# Patient Record
Sex: Female | Born: 1965 | Race: White | Hispanic: No | Marital: Single | Smoking: Current every day smoker
Health system: Southern US, Community
[De-identification: ages and names within clinical notes are randomized; demographics above are authoritative.]

## PROBLEM LIST (undated history)

## (undated) DIAGNOSIS — I10 Essential (primary) hypertension: Secondary | ICD-10-CM

## (undated) HISTORY — PX: APPENDECTOMY: SHX54

---

## 1999-01-18 ENCOUNTER — Emergency Department (HOSPITAL_COMMUNITY): Admission: EM | Admit: 1999-01-18 | Discharge: 1999-01-18 | Payer: Self-pay

## 2000-04-05 ENCOUNTER — Emergency Department (HOSPITAL_COMMUNITY): Admission: EM | Admit: 2000-04-05 | Discharge: 2000-04-05 | Payer: Self-pay | Admitting: Emergency Medicine

## 2000-08-16 ENCOUNTER — Emergency Department (HOSPITAL_COMMUNITY): Admission: EM | Admit: 2000-08-16 | Discharge: 2000-08-16 | Payer: Self-pay | Admitting: *Deleted

## 2000-08-31 ENCOUNTER — Encounter: Payer: Self-pay | Admitting: Emergency Medicine

## 2000-08-31 ENCOUNTER — Emergency Department (HOSPITAL_COMMUNITY): Admission: EM | Admit: 2000-08-31 | Discharge: 2000-08-31 | Payer: Self-pay | Admitting: Emergency Medicine

## 2000-12-07 ENCOUNTER — Emergency Department (HOSPITAL_COMMUNITY): Admission: EM | Admit: 2000-12-07 | Discharge: 2000-12-07 | Payer: Self-pay | Admitting: Emergency Medicine

## 2001-01-17 ENCOUNTER — Emergency Department (HOSPITAL_COMMUNITY): Admission: EM | Admit: 2001-01-17 | Discharge: 2001-01-18 | Payer: Self-pay | Admitting: Emergency Medicine

## 2001-01-17 ENCOUNTER — Emergency Department (HOSPITAL_COMMUNITY): Admission: EM | Admit: 2001-01-17 | Discharge: 2001-01-17 | Payer: Self-pay | Admitting: Emergency Medicine

## 2001-02-07 ENCOUNTER — Emergency Department (HOSPITAL_COMMUNITY): Admission: EM | Admit: 2001-02-07 | Discharge: 2001-02-07 | Payer: Self-pay | Admitting: Emergency Medicine

## 2001-03-05 ENCOUNTER — Emergency Department (HOSPITAL_COMMUNITY): Admission: EM | Admit: 2001-03-05 | Discharge: 2001-03-05 | Payer: Self-pay | Admitting: Internal Medicine

## 2001-05-14 ENCOUNTER — Encounter: Payer: Self-pay | Admitting: Internal Medicine

## 2001-05-14 ENCOUNTER — Emergency Department (HOSPITAL_COMMUNITY): Admission: EM | Admit: 2001-05-14 | Discharge: 2001-05-14 | Payer: Self-pay | Admitting: Internal Medicine

## 2001-07-08 ENCOUNTER — Emergency Department (HOSPITAL_COMMUNITY): Admission: EM | Admit: 2001-07-08 | Discharge: 2001-07-08 | Payer: Self-pay | Admitting: Emergency Medicine

## 2001-08-01 ENCOUNTER — Emergency Department (HOSPITAL_COMMUNITY): Admission: EM | Admit: 2001-08-01 | Discharge: 2001-08-01 | Payer: Self-pay | Admitting: Emergency Medicine

## 2002-01-31 ENCOUNTER — Emergency Department (HOSPITAL_COMMUNITY): Admission: EM | Admit: 2002-01-31 | Discharge: 2002-01-31 | Payer: Self-pay | Admitting: Emergency Medicine

## 2002-07-23 ENCOUNTER — Emergency Department (HOSPITAL_COMMUNITY): Admission: EM | Admit: 2002-07-23 | Discharge: 2002-07-23 | Payer: Self-pay | Admitting: Emergency Medicine

## 2003-08-20 ENCOUNTER — Emergency Department (HOSPITAL_COMMUNITY): Admission: EM | Admit: 2003-08-20 | Discharge: 2003-08-20 | Payer: Self-pay | Admitting: Emergency Medicine

## 2004-01-05 ENCOUNTER — Emergency Department (HOSPITAL_COMMUNITY): Admission: EM | Admit: 2004-01-05 | Discharge: 2004-01-05 | Payer: Self-pay | Admitting: Emergency Medicine

## 2004-09-19 ENCOUNTER — Emergency Department (HOSPITAL_COMMUNITY): Admission: EM | Admit: 2004-09-19 | Discharge: 2004-09-19 | Payer: Self-pay | Admitting: Emergency Medicine

## 2004-09-30 ENCOUNTER — Ambulatory Visit: Payer: Self-pay | Admitting: Internal Medicine

## 2005-04-25 ENCOUNTER — Emergency Department (HOSPITAL_COMMUNITY): Admission: EM | Admit: 2005-04-25 | Discharge: 2005-04-25 | Payer: Self-pay | Admitting: Emergency Medicine

## 2007-02-28 ENCOUNTER — Emergency Department (HOSPITAL_COMMUNITY): Admission: EM | Admit: 2007-02-28 | Discharge: 2007-02-28 | Payer: Self-pay | Admitting: Family Medicine

## 2011-11-07 ENCOUNTER — Emergency Department: Payer: Self-pay | Admitting: Emergency Medicine

## 2016-06-28 ENCOUNTER — Encounter: Payer: Self-pay | Admitting: Medical Oncology

## 2016-06-28 ENCOUNTER — Emergency Department
Admission: EM | Admit: 2016-06-28 | Discharge: 2016-06-28 | Disposition: A | Discharge status: Home / Self Care | Payer: Self-pay | Attending: Emergency Medicine | Admitting: Emergency Medicine

## 2016-06-28 DIAGNOSIS — K047 Periapical abscess without sinus: Secondary | ICD-10-CM | POA: Insufficient documentation

## 2016-06-28 MED ORDER — IBUPROFEN 600 MG PO TABS: 600.0000 mg | ORAL_TABLET | Freq: Three times a day (TID) | ORAL | 0 refills | Status: DC | PRN

## 2016-06-28 MED ORDER — CLINDAMYCIN HCL 150 MG PO CAPS: 150.0000 mg | ORAL_CAPSULE | Freq: Four times a day (QID) | ORAL | 0 refills | Status: DC

## 2016-06-28 MED ORDER — OXYCODONE-ACETAMINOPHEN 5-325 MG PO TABS: 1.0000 | ORAL_TABLET | Freq: Four times a day (QID) | ORAL | 0 refills | Status: AC | PRN

## 2016-06-28 MED ORDER — ERYTHROMYCIN BASE 500 MG PO TABS: 500.0000 mg | ORAL_TABLET | Freq: Four times a day (QID) | ORAL | 0 refills | Status: DC

## 2016-06-28 NOTE — Discharge Instructions (Signed)
Follow-up for full list of dental clinics provided OPTIONS FOR DENTAL FOLLOW UP CARE  [place] Department of Health and Human Services - Local Safety Net Dental Clinics [email or web address]://www.ncdhhs.gov/dph/oralhealth/services/safetynetclinics.htm   [facility] [phone])  [facility] [phone])  [facility] [phone])  [facility] [phone])  [facility] [phone]) This clinic caters to the indigent population and is on a lottery system. Location: [employer], [address], [address], [place] [facility] Hours: Wednesdays from 6pm - 9pm, patients seen by a lottery system. For dates, call or go to [email or web address] Services: Cleanings, fillings and simple extractions. Payment Options: DENTAL WORK IS FREE OF CHARGE. Bring proof of income or support. Best way to get seen: Arrive at 5:15 pm - this is a lottery, NOT first come/first serve, so arriving earlier will not increase your chances of being seen.     [facility] [phone] Select option 1 for emergencies   Location: [facility] [employer] [facility], [address], [address], [place] [facility] Hours: No walk-ins accepted - call the day before to schedule an appointment. Check in times are 9:30 am and 1:30 pm. Services: Simple extractions, temporary fillings, pulpectomy/pulp debridement, uncomplicated abscess drainage. Payment Options: PAYMENT IS DUE AT THE TIME OF SERVICE.  Fee is usually $100-200, additional surgical procedures (e.g. abscess drainage) may be extra. Cash, checks, Visa/MasterCard accepted.  Can file Medicaid if patient is covered for dental - patient should call case worker to check. No discount for [facility] patients. Best way to get seen: MUST call the day before and get onto the schedule. Can usually be seen the next 1-2 days. No walk-ins accepted.      [facility] [phone]   Location: [facility], [address], [place] [facility] Hours: M, W, Th, F 8am or 1:30pm, Tues 9a or 1:30 - first come/first served. Services: Simple extractions, temporary fillings, uncomplicated abscess drainage.  You do not need to be [place] resident. Payment Options: PAYMENT IS DUE AT THE TIME OF SERVICE. Dental insurance, otherwise sliding scale - bring proof of income or support. Depending on income and treatment needed, cost is usually $50-200. Best way to get seen: Arrive early as it is first come/first served.     [facility] [phone]   Location: [address] Clinic Hours: Mon-Thu 8a-5p Services: Most basic dental services including extractions and fillings. Payment Options: PAYMENT IS DUE AT THE TIME OF SERVICE. Sliding scale, up to 50% off - bring proof if income or support. Medicaid with dental option accepted. Best way to get seen: Call to schedule an appointment, can usually be seen within 2 weeks OR they will try to see walk-ins - show up at 8a or 2p (you may have to wait).     [facility] [phone] [place] RESIDENTS ONLY   Location: [facility], [address], [place], [place] [address] Clinic Hours: By appointment only. Monday - Thursday 8am-5pm, Friday 8am-12pm Services: Cleanings, fillings, extractions. Payment Options: PAYMENT IS DUE AT THE TIME OF SERVICE. Cash, Visa or MasterCard. Sliding scale - $30 minimum per service. Best way to get seen: Come in to office, complete packet and make an appointment - need proof of income or support monies for each household member and proof of [place] residence. Usually takes about a month to get in.     [facility] °[phone] °  °Location: °[address] [place] °Clinic Hours: Walk-in Urgent Care  Dental Services are offered Monday-Friday mornings only. °The numbers of emergencies accepted daily is limited to the number of °providers available. °Maximum 15 - Mondays, Wednesdays & Thursdays °Maximum 10 - Tuesdays & Fridays °Services: °You do not need to be a [place] resident to be seen for a dental emergency. °Emergencies are defined as pain, swelling, abnormal bleeding, or dental trauma. Walkins will receive x-rays if needed. °NOTE: Dental cleaning is not an emergency. °Payment Options: °PAYMENT IS DUE AT THE TIME OF SERVICE. °Minimum co-pay is $40.00 for uninsured patients. °Minimum co-pay is $3.00 for Medicaid with dental coverage. °Dental Insurance is accepted and must be presented at time of visit. °Medicare does not cover dental. °Forms of payment: Cash, credit card, checks. °Best way to get seen: °If not previously registered with the clinic, walk-in dental registration begins at 7:15 am and is on a first come/first serve basis. °If previously registered with the clinic, call to make an appointment. °  °  °[facility] °[phone] °[place] RESIDENTS ONLY °  °Location: °[address], [place], [place] °Clinic Hours: °Mon-Thu 10a-2p °Services: Extractions only! °Payment Options: °FREE (donations accepted) - bring proof of income or support °Best way to get seen: °Call and schedule an appointment OR come at 8am on the 1st Monday of every month (except for holidays) when it is first come/first served. °  °  °[facility] °[phone] °  °Location: °[address], [place] °Clinic Hours: °Friday mornings °Services, Payment Options, Best way to get seen: °Call for info ° °

## 2016-06-28 NOTE — ED Triage Notes (Signed)
Left lower dental pain since sunday

## 2016-06-28 NOTE — ED Provider Notes (Signed)
[facility] Emergency Department Provider Note    None    (approximate)  I have reviewed the triage vital signs and the nursing notes.   HISTORY  Chief Complaint Dental Pain   HPI Carrie Mcdaniel is a 50 y.o. female presents to the ED with dental pain for 2 days. Patient states dental pain is coming from the tooth on the bottom left side and is beginning to radiate down the left side of her neck. She describes the pain as throbbing, 9/10, and accompanied with swelling and frontal headache. The pain awakens her from sleep and prevents her from chewing food on the left side. She has a dentist appointment next week for tooth extraction, but Tylenol and ibuprofen are no longer effective for the pain. Other palliative measures include salt water, garlic clove, hot and cold compresses. Patient denies ear pain, eye pain, fever, trouble swallowing, vision changes, shortness of breath, or chest pain.  History reviewed. No pertinent past medical history.  There are no active problems to display for this patient.   No past surgical history on file.  Prior to Admission medications   Medication Sig Start Date End Date Taking? Authorizing Provider  erythromycin base (E-MYCIN) 500 MG tablet Take 1 tablet (500 mg total) by mouth 4 (four) times daily. 06/28/16   Joni Reining, PA-C  ibuprofen (ADVIL,MOTRIN) 600 MG tablet Take 1 tablet (600 mg total) by mouth every 8 (eight) hours as needed. 06/28/16   Joni Reining, PA-C  oxyCODONE-acetaminophen (ROXICET) 5-325 MG tablet Take 1 tablet by mouth every 6 (six) hours as needed. 06/28/16 06/28/17  Joni Reining, PA-C    Allergies Penicillins  No family history on file.  Social History Social History  Substance Use Topics  . Smoking status: Not on file  . Smokeless tobacco: Not on file  . Alcohol use Not on file    Review of Systems Constitutional: No fever/chills Eyes: No visual changes.No eye pain ENT: Left-sided  dental pain pain radiating to neck with facial swelling. No sore throat. No trouble swallowing. No mastoid pain. Cardiovascular: Denies chest pain. Respiratory: Denies shortness of breath. Gastrointestinal: No abdominal pain.  No nausea, no vomiting.  No diarrhea.  No constipation. Genitourinary: Negative for dysuria. Musculoskeletal: Negative for back pain. Skin: Negative for rash. Neurological: Positive for headache. Negative focal weakness or numbness. 10-point ROS otherwise negative.  ____________________________________________   PHYSICAL EXAM:  VITAL SIGNS: ED Triage Vitals  Enc Vitals Group     BP 06/28/16 0839 (!) 158/87     Pulse Rate 06/28/16 0839 76     Resp 06/28/16 0839 16     Temp 06/28/16 0839 98.3 F (36.8 C)     Temp Source 06/28/16 0839 Oral     SpO2 06/28/16 0839 99 %     Weight 06/28/16 0839 147 lb 9 oz (66.9 kg)     Height 06/28/16 0839  (1.727 m)     Head Circumference --      Peak Flow --      Pain Score 06/28/16 0835 9     Pain Loc --      Pain Edu? --      Excl. in GC? --    Constitutional: Alert and oriented. Well appearing and in no acute distress. Eyes: Conjunctivae are normal. PERRL. EOMI. Head: Atraumatic. Nose: No congestion/rhinnorhea. Mouth/Throat: Teeth discoloration and gum swelling left lower jaw. Tenderness to palpation of tooth. Mild facial swelling at left jaw  noted. Mucous membranes are moist.  Oropharynx non-erythematous. Neck: No stridor.  Hematological/Lymphatic/Immunilogical: No cervical lymphadenopathy. Cardiovascular: Normal rate, regular rhythm. Grossly normal heart sounds.  Good peripheral circulation. Respiratory: Normal respiratory effort.  No retractions. Lungs CTAB. Gastrointestinal: Soft and nontender. No distention. No abdominal bruits. No CVA tenderness. Genitourinary: Deferred Musculoskeletal: No lower extremity tenderness nor edema.  No joint effusions. Neurologic:  Normal speech and language. No gross focal  neurologic deficits are appreciated. No gait instability. Skin:  Skin is warm, dry and intact. No rash noted. Psychiatric: Mood and affect are normal. Speech and behavior are normal.  ____________________________________________   LABS (all labs ordered are listed, but only abnormal results are displayed)  Labs Reviewed - No data to display ____________________________________________  EKG  ____________________________________________  RADIOLOGY  ____________________________________________   PROCEDURES  Procedure(s) performed: None  Procedures  Critical Care performed: No  ____________________________________________   INITIAL IMPRESSION / ASSESSMENT AND PLAN / ED COURSE  Pertinent labs & imaging results that were available during my care of the patient were reviewed by me and considered in my medical decision making (see chart for details).  Dental abscess. Patient given discharge Instructions. Patient advised to follow-up for scheduled an appointment next week. Patient given a prescription for erythromycin, ibuprofen, and Percocets.  Clinical Course     ____________________________________________   FINAL CLINICAL IMPRESSION(S) / ED DIAGNOSES  Final diagnoses:  Abscess, dental      NEW MEDICATIONS STARTED DURING THIS VISIT:  New Prescriptions   ERYTHROMYCIN BASE (E-MYCIN) 500 MG TABLET    Take 1 tablet (500 mg total) by mouth 4 (four) times daily.   IBUPROFEN (ADVIL,MOTRIN) 600 MG TABLET    Take 1 tablet (600 mg total) by mouth every 8 (eight) hours as needed.   OXYCODONE-ACETAMINOPHEN (ROXICET) 5-325 MG TABLET    Take 1 tablet by mouth every 6 (six) hours as needed.     Note:  This document was prepared using Dragon voice recognition software and may include unintentional dictation errors.    Joni Reiningonald K Smith, PA-C 06/28/16 1027    Joni Reiningonald K Smith, PA-C 06/28/16 1027    Nita Sicklearolina Veronese, MD 06/28/16 [phone]

## 2017-06-25 ENCOUNTER — Emergency Department
Admission: EM | Admit: 2017-06-25 | Discharge: 2017-06-25 | Disposition: A | Discharge status: Home / Self Care | Payer: Self-pay | Attending: Emergency Medicine | Admitting: Emergency Medicine

## 2017-06-25 ENCOUNTER — Encounter: Payer: Self-pay | Admitting: Emergency Medicine

## 2017-06-25 ENCOUNTER — Emergency Department: Payer: Self-pay

## 2017-06-25 DIAGNOSIS — J40 Bronchitis, not specified as acute or chronic: Secondary | ICD-10-CM | POA: Insufficient documentation

## 2017-06-25 DIAGNOSIS — F17210 Nicotine dependence, cigarettes, uncomplicated: Secondary | ICD-10-CM | POA: Insufficient documentation

## 2017-06-25 MED ORDER — PREDNISONE 20 MG PO TABS
60.0000 mg | ORAL_TABLET | Freq: Once | ORAL | Status: AC
  Administered 2017-06-25: 60 mg via ORAL
  Filled 2017-06-25: qty 3

## 2017-06-25 MED ORDER — AZITHROMYCIN 250 MG PO TABS: ORAL_TABLET | ORAL | 0 refills | Status: DC

## 2017-06-25 MED ORDER — AZITHROMYCIN 500 MG PO TABS
500.0000 mg | ORAL_TABLET | Freq: Once | ORAL | Status: AC
  Administered 2017-06-25: 500 mg via ORAL
  Filled 2017-06-25: qty 1

## 2017-06-25 MED ORDER — DOXYCYCLINE HYCLATE 100 MG PO TABS
100.0000 mg | ORAL_TABLET | Freq: Once | ORAL | Status: DC
Start: 2017-06-25 — End: 2017-06-25

## 2017-06-25 MED ORDER — IPRATROPIUM-ALBUTEROL 0.5-2.5 (3) MG/3ML IN SOLN
3.0000 mL | Freq: Once | RESPIRATORY_TRACT | Status: AC
  Administered 2017-06-25: 3 mL via RESPIRATORY_TRACT
  Filled 2017-06-25: qty 3

## 2017-06-25 MED ORDER — ALBUTEROL SULFATE HFA 108 (90 BASE) MCG/ACT IN AERS: 2.0000 | INHALATION_SPRAY | Freq: Four times a day (QID) | RESPIRATORY_TRACT | 0 refills | Status: DC | PRN

## 2017-06-25 MED ORDER — PREDNISONE 10 MG PO TABS: ORAL_TABLET | ORAL | 0 refills | Status: DC

## 2017-06-25 NOTE — ED Provider Notes (Signed)
[facility] Emergency Department Provider Note  ____________________________________________  Time seen: Approximately 10:12 AM  I have reviewed the triage vital signs and the nursing notes.   HISTORY  Chief Complaint Cough    HPI Carrie Mcdaniel is a 51 y.o. female presents to the emergency department with a productive cough for 2 weeks. She is coughing up yellow sputum. She had chills the first couple of days but these have resolved. She has tried DayQuil and NyQuil for symptoms. She smokes a pack of cigarettes per day. No sick contacts. She denies headache, nasal congestion, sore throat, shortness of breath, chest pain, nausea, vomiting, abdominal pain.   No past medical history on file.  There are no active problems to display for this patient.   History reviewed. No pertinent surgical history.  Prior to Admission medications   Medication Sig Start Date End Date Taking? Authorizing Provider  albuterol (PROVENTIL HFA;VENTOLIN HFA) 108 (90 Base) MCG/ACT inhaler Inhale 2 puffs into the lungs every 6 (six) hours as needed for wheezing or shortness of breath. 06/25/17   Enid DerryWagner, Kyuss Hale, PA-C  azithromycin (ZITHROMAX Z-PAK) 250 MG tablet Take 2 tablets (500 mg) on  Day 1,  followed by 1 tablet (250 mg) once daily on Days 2 through 5. 06/25/17   Enid DerryWagner, [place] Luppino, PA-C  clindamycin (CLEOCIN) 150 MG capsule Take 1 capsule (150 mg total) by mouth 4 (four) times daily. 06/28/16   Joni ReiningSmith, Ronald K, PA-C  erythromycin base (E-MYCIN) 500 MG tablet Take 1 tablet (500 mg total) by mouth 4 (four) times daily. 06/28/16   Joni ReiningSmith, Ronald K, PA-C  ibuprofen (ADVIL,MOTRIN) 600 MG tablet Take 1 tablet (600 mg total) by mouth every 8 (eight) hours as needed. 06/28/16   Joni ReiningSmith, Ronald K, PA-C  ibuprofen (ADVIL,MOTRIN) 600 MG tablet Take 1 tablet (600 mg total) by mouth every 8 (eight) hours as needed. 06/28/16   Joni ReiningSmith, Ronald K, PA-C  oxyCODONE-acetaminophen (ROXICET) 5-325 MG tablet Take 1  tablet by mouth every 6 (six) hours as needed. 06/28/16 06/28/17  Joni ReiningSmith, Ronald K, PA-C  oxyCODONE-acetaminophen (ROXICET) 5-325 MG tablet Take 1 tablet by mouth every 6 (six) hours as needed. 06/28/16 06/28/17  Joni ReiningSmith, Ronald K, PA-C  predniSONE (DELTASONE) 10 MG tablet Take 6 tablets on day 1, take 5 tablets on day 2, take 4 tablets on day 3, take 3 tablets on day 4, take 2 tablets on day 5, take 1 tablet on day 6 06/25/17   Enid DerryWagner, Caidin Heidenreich, PA-C    Allergies Penicillins  No family history on file.  Social History Social History  Substance Use Topics  . Smoking status: Current Every Day Smoker    Packs/day: 1.00    Types: Cigarettes  . Smokeless tobacco: Never Used  . Alcohol use Not on file     Review of Systems  Eyes: No visual changes. No discharge. ENT: Negative for congestion and rhinorrhea. Cardiovascular: No chest pain. Respiratory: Positive for cough. No SOB. Gastrointestinal: No abdominal pain.  No nausea, no vomiting.  No diarrhea.  No constipation. Musculoskeletal: Negative for musculoskeletal pain. Skin: Negative for rash, abrasions, lacerations, ecchymosis. Neurological: Negative for headaches.   ____________________________________________   PHYSICAL EXAM:  VITAL SIGNS: ED Triage Vitals  Enc Vitals Group     BP 06/25/17 0917 (!) 174/103     Pulse Rate 06/25/17 0917 (!) 57     Resp 06/25/17 0917 20     Temp 06/25/17 0917 98.2 F (36.8 C)     Temp Source  06/25/17 0917 Oral     SpO2 06/25/17 0917 100 %     Weight 06/25/17 0917 145 lb (65.8 kg)     Height 06/25/17 0917 5\' 8"  (1.727 m)     Head Circumference --      Peak Flow --      Pain Score 06/25/17 0916 0     Pain Loc --      Pain Edu? --      Excl. in GC? --      Constitutional: Alert and oriented. Well appearing and in no acute distress. Eyes: Conjunctivae are normal. PERRL. EOMI. No discharge. Head: Atraumatic. ENT: No frontal and maxillary sinus tenderness.      Ears: Tympanic membranes pearly  gray with good landmarks. No discharge.      Nose: No congestion/rhinnorhea.      Mouth/Throat: Mucous membranes are moist. Oropharynx non-erythematous. Tonsils not enlarged. No exudates. Uvula midline. Neck: No stridor.   Hematological/Lymphatic/Immunilogical: No cervical lymphadenopathy. Cardiovascular: Normal rate, regular rhythm.  Good peripheral circulation. Respiratory: Normal respiratory effort without tachypnea or retractions. Lungs CTAB. Good air entry to the bases with no decreased or absent breath sounds. Gastrointestinal: Bowel sounds 4 quadrants. Soft and nontender to palpation. No guarding or rigidity. No palpable masses. No distention. Musculoskeletal: Full range of motion to all extremities. No gross deformities appreciated. Neurologic:  Normal speech and language. No gross focal neurologic deficits are appreciated.  Skin:  Skin is warm, dry and intact. No rash noted.   ____________________________________________   LABS (all labs ordered are listed, but only abnormal results are displayed)  Labs Reviewed - No data to display ____________________________________________  EKG   ____________________________________________  RADIOLOGY Lexine Baton, personally viewed and evaluated these images (plain radiographs) as part of my medical decision making, as well as reviewing the written report by the radiologist.  Dg Chest 2 View  Result Date: 06/25/2017 CLINICAL DATA:  Cough for 2 weeks EXAM: CHEST  2 VIEW COMPARISON:  11/07/2011 FINDINGS: Normal heart size and mediastinal contours. No acute infiltrate or edema. No effusion or pneumothorax. No acute osseous findings. IMPRESSION: Negative chest. Electronically Signed   By: Marnee Spring M.D.   On: 06/25/2017 09:49    ____________________________________________    PROCEDURES  Procedure(s) performed:    Procedures    Medications  ipratropium-albuterol (DUONEB) 0.5-2.5 (3) MG/3ML nebulizer solution 3 mL (3  mLs Nebulization Given 06/25/17 1051)  predniSONE (DELTASONE) tablet 60 mg (60 mg Oral Given 06/25/17 1049)  azithromycin (ZITHROMAX) tablet 500 mg (500 mg Oral Given 06/25/17 1048)     ____________________________________________   INITIAL IMPRESSION / ASSESSMENT AND PLAN / ED COURSE  Pertinent labs & imaging results that were available during my care of the patient were reviewed by me and considered in my medical decision making (see chart for details).  Review of the [place] CSRS was performed in accordance of the [facility] prior to dispensing any controlled drugs.   Patient's diagnosis is consistent with bronchitis. Vital signs and exam are reassuring. No acute cardiopulmonary processes on x-ray. Patient appears well and is staying well hydrated. Patient should alternate tylenol and ibuprofen for fever. Patient feels comfortable going home. Patient will be discharged home with prescriptions for azithromycin, albuterol, prednisone. Patient is to follow up with PCP as needed or otherwise directed. Patient is given ED precautions to return to the ED for any worsening or new symptoms.  ____________________________________________  FINAL CLINICAL IMPRESSION(S) / ED DIAGNOSES  Final diagnoses:  Bronchitis  NEW MEDICATIONS STARTED DURING THIS VISIT:  Discharge Medication List as of 06/25/2017 10:19 AM    START taking these medications   Details  albuterol (PROVENTIL HFA;VENTOLIN HFA) 108 (90 Base) MCG/ACT inhaler Inhale 2 puffs into the lungs every 6 (six) hours as needed for wheezing or shortness of breath., Starting Sun 06/25/2017, Print    azithromycin (ZITHROMAX Z-PAK) 250 MG tablet Take 2 tablets (500 mg) on  Day 1,  followed by 1 tablet (250 mg) once daily on Days 2 through 5., Print    predniSONE (DELTASONE) 10 MG tablet Take 6 tablets on day 1, take 5 tablets on day 2, take 4 tablets on day 3, take 3 tablets on day 4, take 2 tablets on day 5, take 1 tablet on day 6, Print             This chart was dictated using voice recognition software/Dragon. Despite best efforts to proofread, errors can occur which can change the meaning. Any change was purely unintentional.    Enid DerryWagner, Jochebed Bills, PA-C 06/25/17 1322    Jeanmarie PlantMcShane, James A, MD 06/26/17 [phone]

## 2017-06-25 NOTE — ED Triage Notes (Signed)
Pt c/o cough x 2 weeks states she thinks she is getting bronchitis. Denies any fevers now, but did intiially when sxs started. Pt having productive sputum, thick yellow.

## 2017-06-25 NOTE — ED Notes (Signed)
FIRST NURSE NOTE: Pt c/o cough x 2 weeks thinks she has bronchitis.

## 2017-08-24 ENCOUNTER — Emergency Department (HOSPITAL_COMMUNITY)
Admission: EM | Admit: 2017-08-24 | Discharge: 2017-08-24 | Disposition: A | Discharge status: Home / Self Care | Payer: Self-pay | Attending: Emergency Medicine | Admitting: Emergency Medicine

## 2017-08-24 ENCOUNTER — Encounter (HOSPITAL_COMMUNITY): Payer: Self-pay | Admitting: Emergency Medicine

## 2017-08-24 DIAGNOSIS — G589 Mononeuropathy, unspecified: Secondary | ICD-10-CM | POA: Insufficient documentation

## 2017-08-24 DIAGNOSIS — F17210 Nicotine dependence, cigarettes, uncomplicated: Secondary | ICD-10-CM | POA: Insufficient documentation

## 2017-08-24 LAB — CBG MONITORING, ED: GLUCOSE-CAPILLARY: 71 mg/dL (ref 65–99)

## 2017-08-24 MED ORDER — PREDNISONE 20 MG PO TABS: ORAL_TABLET | ORAL | 0 refills | Status: DC

## 2017-08-24 NOTE — ED Provider Notes (Signed)
[facility]-EMERGENCY DEPT Provider Note   CSN: [id number] Arrival date & time: 08/24/17  4098     History   Chief Complaint Chief Complaint  Patient presents with  . Numbness    LLE    HPI Carrie Mcdaniel is a 51 y.o. female.  Patient is 51 year old female with no significant past medical history other than tobacco use who presents with left leg numbness. She states that about 4 days ago she noted that she's having some numbness along the lateral aspect of her left lower leg. It starts just below the knee and goes down to the lateral toes of the left foot. She states that leg is giving out on her twice. She feels like it's weak mostly on the outside of the foot. She feels like her big toe has normal strength. She denies any weakness in upper leg. She denies any weakness in her arms. No numbness in her upper extremities. No slurred speech or facial numbness. No vision changes. She denies any injury to the leg. She states that she doesn't wear socks to go up that high. She doesn't wear boots that go up that high.  She has not been taking any medications for it.  He feels like her symptoms got a little bit better today. She denies any associated back pain.      History reviewed. No pertinent past medical history.  There are no active problems to display for this patient.   History reviewed. No pertinent surgical history.  OB History    No data available       Home Medications    Prior to Admission medications   Medication Sig Start Date End Date Taking? Authorizing Provider  Aspirin-Salicylamide-Caffeine (BC HEADACHE POWDER PO) Take 1 packet by mouth every 8 (eight) hours as needed (HA).   Yes [provider]  ibuprofen (ADVIL,MOTRIN) 200 MG tablet Take 600 mg by mouth every 6 (six) hours as needed for headache.   Yes [provider]  albuterol (PROVENTIL HFA;VENTOLIN HFA) 108 (90 Base) MCG/ACT inhaler Inhale 2 puffs into the lungs every 6 (six) hours as needed  for wheezing or shortness of breath. Patient not taking: Reported on 08/24/2017 06/25/17   Enid Derry, PA-C  azithromycin (ZITHROMAX Z-PAK) 250 MG tablet Take 2 tablets (500 mg) on  Day 1,  followed by 1 tablet (250 mg) once daily on Days 2 through 5. Patient not taking: Reported on 08/24/2017 06/25/17   Enid Derry, PA-C  predniSONE (DELTASONE) 20 MG tablet 3 tabs po day one, then 2 po daily x 4 days 08/24/17   Rolan Bucco, MD    Family History History reviewed. No pertinent family history.  Social History Social History  Substance Use Topics  . Smoking status: Current Every Day Smoker    Packs/day: 1.00    Types: Cigarettes  . Smokeless tobacco: Never Used  . Alcohol use Yes     Allergies   Penicillins   Review of Systems Review of Systems  Constitutional: Negative for chills, diaphoresis, fatigue and fever.  HENT: Negative for congestion, rhinorrhea and sneezing.   Eyes: Negative.   Respiratory: Negative for cough, chest tightness and shortness of breath.   Cardiovascular: Negative for chest pain and leg swelling.  Gastrointestinal: Negative for abdominal pain, blood in stool, diarrhea, nausea and vomiting.  Genitourinary: Negative for difficulty urinating, flank pain, frequency and hematuria.  Musculoskeletal: Negative for arthralgias and back pain.  Skin: Negative for rash.  Neurological: Positive for weakness and numbness.  Negative for dizziness, speech difficulty and headaches.     Physical Exam Updated Vital Signs BP (!) 150/98 (BP Location: Left Arm)   Pulse 73   Temp 97.9 F (36.6 C) (Oral)   Resp 16   Ht  (1.727 m)   Wt 65.8 kg (145 lb)   SpO2 100%   BMI 22.05 kg/m   Physical Exam  Constitutional: She is oriented to person, place, and time. She appears well-developed and well-nourished.  HENT:  Head: Normocephalic and atraumatic.  Neck: Normal range of motion. Neck supple.  Cardiovascular: Normal rate.   Pulmonary/Chest: Effort normal.    Musculoskeletal: She exhibits no edema or tenderness.  Patient has no tenderness along the left lower extremity. There is no tenderness along the spine. Negative straight leg raise. She has some subjective numbness to light touch over the left fibula area. From the fibula down to the fourth and fifth digits of the left foot. She has normal flexion and extension of the foot. There is no foot drop. She has normal motor function in the thigh flexors and extensors. Pedal pulses are intact. There is no swelling of the leg.  No discoloration of the leg  Neurological: She is alert and oriented to person, place, and time.  Skin: Skin is warm and dry.  Psychiatric: She has a normal mood and affect.     ED Treatments / Results  Labs (all labs ordered are listed, but only abnormal results are displayed) Labs Reviewed  CBG MONITORING, ED    EKG  EKG Interpretation None       Radiology No results found.  Procedures Procedures (including critical care time)  Medications Ordered in ED Medications - No data to display   Initial Impression / Assessment and Plan / ED Course  I have reviewed the triage vital signs and the nursing notes.  Pertinent labs & imaging results that were available during my care of the patient were reviewed by me and considered in my medical decision making (see chart for details).     Patient reports with numbness to the lateral aspect of the left lower leg. This seems to be a peripheral nerve injury. I don't see any evidence of a central etiology such as a stroke. There is no swelling or other indication of a DVT. She has no suggestions of poor perfusion.  Her symptoms seem a little better today.  She has normal gait without foot drop.  Will start a course of steroids and will give her resources for possible outpatient follow-up. Return precautions were given.  Final Clinical Impressions(s) / ED Diagnoses   Final diagnoses:  Mononeuropathy    New  Prescriptions New Prescriptions   PREDNISONE (DELTASONE) 20 MG TABLET    3 tabs po day one, then 2 po daily x 4 days     Rolan Bucco, MD 08/24/17 [phone]

## 2017-08-24 NOTE — ED Triage Notes (Signed)
Pt c/o intermittent numbness to LLE x 4 days. Pt denies pain. States numbness is mostly from knee to ankle. Denies injury.

## 2018-01-13 ENCOUNTER — Encounter: Payer: Self-pay | Admitting: Emergency Medicine

## 2018-01-13 ENCOUNTER — Emergency Department
Admission: EM | Admit: 2018-01-13 | Discharge: 2018-01-13 | Disposition: A | Discharge status: Home / Self Care | Payer: Self-pay | Attending: Emergency Medicine | Admitting: Emergency Medicine

## 2018-01-13 ENCOUNTER — Other Ambulatory Visit: Payer: Self-pay

## 2018-01-13 DIAGNOSIS — K529 Noninfective gastroenteritis and colitis, unspecified: Secondary | ICD-10-CM | POA: Insufficient documentation

## 2018-01-13 DIAGNOSIS — F17210 Nicotine dependence, cigarettes, uncomplicated: Secondary | ICD-10-CM | POA: Insufficient documentation

## 2018-01-13 LAB — COMPREHENSIVE METABOLIC PANEL
ALK PHOS: 102 U/L (ref 38–126)
ALT: 28 U/L (ref 14–54)
AST: 32 U/L (ref 15–41)
Albumin: 4.1 g/dL (ref 3.5–5.0)
Anion gap: 7 (ref 5–15)
BUN: 13 mg/dL (ref 6–20)
CALCIUM: 8.8 mg/dL — AB (ref 8.9–10.3)
CHLORIDE: 105 mmol/L (ref 101–111)
CO2: 23 mmol/L (ref 22–32)
CREATININE: 0.68 mg/dL (ref 0.44–1.00)
GFR calc Af Amer: >60 mL/min (ref >60)
GFR calc non Af Amer: >60 mL/min (ref >60)
GLUCOSE: 144 mg/dL — AB (ref 65–99)
Potassium: 4.1 mmol/L (ref 3.5–5.1)
SODIUM: 135 mmol/L (ref 135–145)
Total Bilirubin: 0.5 mg/dL (ref 0.3–1.2)
Total Protein: 7.3 g/dL (ref 6.5–8.1)

## 2018-01-13 LAB — URINALYSIS, COMPLETE (UACMP) WITH MICROSCOPIC
Bacteria, UA: NONE SEEN
Bilirubin Urine: NEGATIVE
GLUCOSE, UA: NEGATIVE mg/dL
HGB URINE DIPSTICK: NEGATIVE
Ketones, ur: NEGATIVE mg/dL
Leukocytes, UA: NEGATIVE
Nitrite: NEGATIVE
PROTEIN: NEGATIVE mg/dL
SPECIFIC GRAVITY, URINE: 1.005 (ref 1.005–1.030)
pH: 5 (ref 5.0–8.0)

## 2018-01-13 LAB — CBC
HCT: 44.7 % (ref 35.0–47.0)
Hemoglobin: 15.3 g/dL (ref 12.0–16.0)
MCH: 31.7 pg (ref 26.0–34.0)
MCHC: 34.2 g/dL (ref 32.0–36.0)
MCV: 92.7 fL (ref 80.0–100.0)
PLATELETS: 231 10*3/uL (ref 150–440)
RBC: 4.82 MIL/uL (ref 3.80–5.20)
RDW: 13.5 % (ref 11.5–14.5)
WBC: 9.3 10*3/uL (ref 3.6–11.0)

## 2018-01-13 LAB — LIPASE, BLOOD: Lipase: 37 U/L (ref 11–51)

## 2018-01-13 MED ORDER — ONDANSETRON HCL 4 MG PO TABS: 4.0000 mg | ORAL_TABLET | Freq: Every day | ORAL | 0 refills | Status: AC | PRN

## 2018-01-13 NOTE — ED Triage Notes (Signed)
N/v/d for three days  

## 2018-01-13 NOTE — ED Notes (Signed)
Pt c/o N/V/D for the past 3 days ,. States everyone in the house has been sick with same sx. Denies fever or abd pain..Marland Kitchen

## 2018-01-13 NOTE — ED Triage Notes (Signed)
NVD. Denies abd pain. Denies fevers. States has been around people with same symptoms.

## 2018-01-13 NOTE — ED Provider Notes (Signed)
[facility] Emergency Department Provider Note  ____________________________________________  Time seen: Approximately 10:52 AM  I have reviewed the triage vital signs and the nursing notes.   HISTORY  Chief Complaint Nausea; Emesis; and Diarrhea    HPI Carrie Mcdaniel is a 52 y.o. female that presents to the emergency department for evaluation of vomiting and diarrhea for 3 days.  Patient estimates that she has about 3 episodes of vomiting and 3 episodes of diarrhea per day.  She states that stools are loose and not watery.  No blood or mucus in stool.  She has not been as hungry, but is drinking well.  She does not feel dehydrated.  She states that everyone that she lives with has the same symptoms and have gotten better in about a week. She came to the emergency department today for a note for work.    No fever, chills, nausea, abdominal pain.   History reviewed. No pertinent past medical history.  There are no active problems to display for this patient.   History reviewed. No pertinent surgical history.  Prior to Admission medications   Medication Sig Start Date End Date Taking? Authorizing Provider  albuterol (PROVENTIL HFA;VENTOLIN HFA) 108 (90 Base) MCG/ACT inhaler Inhale 2 puffs into the lungs every 6 (six) hours as needed for wheezing or shortness of breath. Patient not taking: Reported on 08/24/2017 06/25/17   Enid DerryWagner, Cheresa Siers, PA-C  Aspirin-Salicylamide-Caffeine (BC HEADACHE POWDER PO) Take 1 packet by mouth every 8 (eight) hours as needed (HA).    [provider]  azithromycin (ZITHROMAX Z-PAK) 250 MG tablet Take 2 tablets (500 mg) on  Day 1,  followed by 1 tablet (250 mg) once daily on Days 2 through 5. Patient not taking: Reported on 08/24/2017 06/25/17   Enid DerryWagner, Bane Hagy, PA-C  ibuprofen (ADVIL,MOTRIN) 200 MG tablet Take 600 mg by mouth every 6 (six) hours as needed for headache.    [provider]  ondansetron (ZOFRAN) 4 MG tablet  Take 1 tablet (4 mg total) by mouth daily as needed for nausea or vomiting. 01/13/18 01/13/19  Enid DerryWagner, Jennings Stirling, PA-C  predniSONE (DELTASONE) 20 MG tablet 3 tabs po day one, then 2 po daily x 4 days 08/24/17   Rolan BuccoBelfi, Melanie, MD    Allergies Penicillins  No family history on file.  Social History Social History   Tobacco Use  . Smoking status: Current Every Day Smoker    Packs/day: 1.00    Types: Cigarettes  . Smokeless tobacco: Never Used  Substance Use Topics  . Alcohol use: Yes  . Drug use: No     Review of Systems  Constitutional: No fever/chills Eyes: No visual changes. No discharge. ENT: Negative for congestion and rhinorrhea. Cardiovascular: No chest pain. Respiratory: Negative for cough. No SOB. Gastrointestinal: No abdominal pain.  No nausea. No constipation. Musculoskeletal: Negative for musculoskeletal pain. Skin: Negative for rash, abrasions, lacerations, ecchymosis. Neurological: Negative for headaches.   ____________________________________________   PHYSICAL EXAM:  VITAL SIGNS: ED Triage Vitals  Enc Vitals Group     BP 01/13/18 0958 (!) 163/80     Pulse Rate 01/13/18 0958 (!) 59     Resp 01/13/18 0958 18     Temp 01/13/18 0958 98.4 F (36.9 C)     Temp Source 01/13/18 0958 Oral     SpO2 01/13/18 0958 99 %     Weight 01/13/18 0955 145 lb (65.8 kg)     Height 01/13/18 1010 5\' 8"  (1.727 m)  Head Circumference --      Peak Flow --      Pain Score 01/13/18 1010 8     Pain Loc --      Pain Edu? --      Excl. in GC? --      Constitutional: Alert and oriented. Well appearing and in no acute distress. Eyes: Conjunctivae are normal. PERRL. EOMI. No discharge. Head: Atraumatic. ENT: No frontal and maxillary sinus tenderness.      Ears: Tympanic membranes pearly gray with good landmarks. No discharge.      Nose: No congestion/rhinnorhea.      Mouth/Throat: Mucous membranes are moist. Oropharynx non-erythematous. Tonsils not enlarged. No exudates.  Uvula midline. Neck: No stridor.   Hematological/Lymphatic/Immunilogical: No cervical lymphadenopathy. Cardiovascular: Normal rate, regular rhythm.  Good peripheral circulation. Respiratory: Normal respiratory effort without tachypnea or retractions. Lungs CTAB. Good air entry to the bases with no decreased or absent breath sounds. Gastrointestinal: Bowel sounds 4 quadrants. Soft and nontender to palpation. No guarding or rigidity. No palpable masses. No distention. Musculoskeletal: Full range of motion to all extremities. No gross deformities appreciated. Neurologic:  Normal speech and language. No gross focal neurologic deficits are appreciated.  Skin:  Skin is warm, dry and intact. No rash noted. Psychiatric: Mood and affect are normal. Speech and behavior are normal. Patient exhibits appropriate insight and judgement.   ____________________________________________   LABS (all labs ordered are listed, but only abnormal results are displayed)  Labs Reviewed  COMPREHENSIVE METABOLIC PANEL - Abnormal; Notable for the following components:      Result Value   Glucose, Bld 144 (*)    Calcium 8.8 (*)    All other components within normal limits  URINALYSIS, COMPLETE (UACMP) WITH MICROSCOPIC - Abnormal; Notable for the following components:   Color, Urine STRAW (*)    APPearance CLEAR (*)    Squamous Epithelial / LPF 0-5 (*)    All other components within normal limits  LIPASE, BLOOD  CBC   ____________________________________________  EKG   ____________________________________________  RADIOLOGY   No results found.  ____________________________________________    PROCEDURES  Procedure(s) performed:    Procedures    Medications - No data to display   ____________________________________________   INITIAL IMPRESSION / ASSESSMENT AND PLAN / ED COURSE  Pertinent labs & imaging results that were available during my care of the patient were reviewed by me and  considered in my medical decision making (see chart for details).  Review of the [place] CSRS was performed in accordance of the [facility] prior to dispensing any controlled drugs.     Patient's diagnosis is consistent with viral gastroenteritis. Vital signs, labwork, and exam are reassuring.  Patient denies any abdominal pain.  She does not feel or appear dehydrated.  Patient feels comfortable going home. Patient will be discharged home with prescriptions for zofran. Patient is to follow up with PCP as needed or otherwise directed. Patient is given ED precautions to return to the ED for any worsening or new symptoms.     ____________________________________________  FINAL CLINICAL IMPRESSION(S) / ED DIAGNOSES  Final diagnoses:  Gastroenteritis      NEW MEDICATIONS STARTED DURING THIS VISIT:  ED Discharge Orders        Ordered    ondansetron (ZOFRAN) 4 MG tablet  Daily PRN     01/13/18 1129          This chart was dictated using voice recognition software/Dragon. Despite best efforts to proofread, errors can occur  which can change the meaning. Any change was purely unintentional.    Enid Derry, PA-C 01/13/18 1436    Sharyn Creamer, MD 01/13/18 [phone]

## 2018-07-15 ENCOUNTER — Encounter: Payer: Self-pay | Admitting: Emergency Medicine

## 2018-07-15 ENCOUNTER — Other Ambulatory Visit: Payer: Self-pay

## 2018-07-15 ENCOUNTER — Emergency Department
Admission: EM | Admit: 2018-07-15 | Discharge: 2018-07-15 | Disposition: A | Discharge status: Home / Self Care | Payer: Self-pay | Attending: Emergency Medicine | Admitting: Emergency Medicine

## 2018-07-15 DIAGNOSIS — X500XXA Overexertion from strenuous movement or load, initial encounter: Secondary | ICD-10-CM | POA: Insufficient documentation

## 2018-07-15 DIAGNOSIS — S39012A Strain of muscle, fascia and tendon of lower back, initial encounter: Secondary | ICD-10-CM | POA: Insufficient documentation

## 2018-07-15 DIAGNOSIS — Y929 Unspecified place or not applicable: Secondary | ICD-10-CM | POA: Insufficient documentation

## 2018-07-15 DIAGNOSIS — I10 Essential (primary) hypertension: Secondary | ICD-10-CM | POA: Insufficient documentation

## 2018-07-15 DIAGNOSIS — Y998 Other external cause status: Secondary | ICD-10-CM | POA: Insufficient documentation

## 2018-07-15 DIAGNOSIS — Y9389 Activity, other specified: Secondary | ICD-10-CM | POA: Insufficient documentation

## 2018-07-15 DIAGNOSIS — F17210 Nicotine dependence, cigarettes, uncomplicated: Secondary | ICD-10-CM | POA: Insufficient documentation

## 2018-07-15 DIAGNOSIS — Z79899 Other long term (current) drug therapy: Secondary | ICD-10-CM | POA: Insufficient documentation

## 2018-07-15 MED ORDER — HYDROCHLOROTHIAZIDE 12.5 MG PO TABS: 12.5000 mg | ORAL_TABLET | Freq: Every day | ORAL | 1 refills | Status: DC

## 2018-07-15 MED ORDER — CYCLOBENZAPRINE HCL 5 MG PO TABS: 5.0000 mg | ORAL_TABLET | Freq: Three times a day (TID) | ORAL | 0 refills | Status: DC | PRN

## 2018-07-15 MED ORDER — IBUPROFEN 600 MG PO TABS: 600.0000 mg | ORAL_TABLET | Freq: Four times a day (QID) | ORAL | 0 refills | Status: DC | PRN

## 2018-07-15 NOTE — ED Provider Notes (Signed)
[facility] EMERGENCY DEPARTMENT Provider Note   CSN: [id number] Arrival date & time: 07/15/18  0920     History   Chief Complaint Chief Complaint  Patient presents with  . Back Pain    HPI Carrie Mcdaniel is a 52 y.o. female resents to the emergency department for evaluation of acute low back pain.  Patient states yesterday she was lifting 50 pound boxes repetitively and developed tightness and pulling sensation on the left and right paravertebral muscles lumbar spine.  Patient's pain increased this morning, is 8 out of 10 she describes it as tightness with no numbness tingling radicular symptoms.  No weakness or loss of bowel or bladder symptoms.  She took 200 mg of ibuprofen with minimal relief.  Patient denies any fevers, chest pain, shortness of breath, abdominal pain.  She states she has a history of hypertension, checks her blood pressure twice daily blood pressure normally runs around 160/100-110.  She has an appointment with PCP in October to establish care with new PCP and discuss her blood pressure.  She has not been placed on any blood pressure medications.  She denies any other medical problems.  HPI  History reviewed. No pertinent past medical history.  There are no active problems to display for this patient.   Past Surgical History:  Procedure Laterality Date  . APPENDECTOMY       OB History   None      Home Medications    Prior to Admission medications   Medication Sig Start Date End Date Taking? Authorizing Provider  albuterol (PROVENTIL HFA;VENTOLIN HFA) 108 (90 Base) MCG/ACT inhaler Inhale 2 puffs into the lungs every 6 (six) hours as needed for wheezing or shortness of breath. Patient not taking: Reported on 08/24/2017 06/25/17   Enid DerryWagner, Ashley, PA-C  Aspirin-Salicylamide-Caffeine (BC HEADACHE POWDER PO) Take 1 packet by mouth every 8 (eight) hours as needed (HA).    [provider]  azithromycin (ZITHROMAX Z-PAK) 250 MG  tablet Take 2 tablets (500 mg) on  Day 1,  followed by 1 tablet (250 mg) once daily on Days 2 through 5. Patient not taking: Reported on 08/24/2017 06/25/17   Enid DerryWagner, Ashley, PA-C  cyclobenzaprine (FLEXERIL) 5 MG tablet Take 1-2 tablets (5-10 mg total) by mouth 3 (three) times daily as needed for muscle spasms. 07/15/18   Evon SlackGaines, Tavaris Eudy C, PA-C  hydrochlorothiazide (HYDRODIURIL) 12.5 MG tablet Take 1 tablet (12.5 mg total) by mouth daily. 07/15/18   Evon SlackGaines, Jenay Morici C, PA-C  ibuprofen (ADVIL,MOTRIN) 600 MG tablet Take 1 tablet (600 mg total) by mouth every 6 (six) hours as needed for moderate pain. 07/15/18   Evon SlackGaines, Remedios Mckone C, PA-C  ondansetron (ZOFRAN) 4 MG tablet Take 1 tablet (4 mg total) by mouth daily as needed for nausea or vomiting. 01/13/18 01/13/19  Enid DerryWagner, Ashley, PA-C  predniSONE (DELTASONE) 20 MG tablet 3 tabs po day one, then 2 po daily x 4 days 08/24/17   Rolan BuccoBelfi, Melanie, MD    Family History History reviewed. No pertinent family history.  Social History Social History   Tobacco Use  . Smoking status: Current Every Day Smoker    Packs/day: 1.00    Types: Cigarettes  . Smokeless tobacco: Never Used  Substance Use Topics  . Alcohol use: Yes  . Drug use: No     Allergies   Penicillins   Review of Systems Review of Systems  Constitutional: Negative for activity change, chills, fatigue and fever.  HENT: Negative for congestion, sinus  pressure and sore throat.   Eyes: Negative for visual disturbance.  Respiratory: Negative for cough, chest tightness and shortness of breath.   Cardiovascular: Negative for chest pain and leg swelling.  Gastrointestinal: Negative for abdominal pain, diarrhea, nausea and vomiting.  Genitourinary: Negative for dysuria.  Musculoskeletal: Positive for back pain and myalgias. Negative for arthralgias and gait problem.  Skin: Negative for rash.  Neurological: Negative for weakness, numbness and headaches.  Hematological: Negative for adenopathy.    Psychiatric/Behavioral: Negative for agitation, behavioral problems and confusion.     Physical Exam Updated Vital Signs BP (!) 158/100 (BP Location: Left Arm)   Pulse 82   Temp 98.6 F (37 C) (Oral)   Resp 20   Ht 5\' 8"  (1.727 m)   Wt 63.5 kg   SpO2 99%   BMI 21.29 kg/m   Physical Exam  Constitutional: She is oriented to person, place, and time. She appears well-developed and well-nourished. No distress.  HENT:  Head: Normocephalic and atraumatic.  Mouth/Throat: Oropharynx is clear and moist.  Eyes: Pupils are equal, round, and reactive to light. EOM are normal. Right eye exhibits no discharge. Left eye exhibits no discharge.  Neck: Normal range of motion. Neck supple.  Cardiovascular: Normal rate, regular rhythm and intact distal pulses.  Pulmonary/Chest: Effort normal and breath sounds normal. No respiratory distress. She exhibits no tenderness.  Abdominal: Soft. She exhibits no distension. There is no tenderness.  Musculoskeletal: Normal range of motion. She exhibits no edema.  Examination lumbar spine shows patient has full range of motion lumbar spine with mild discomfort lumbar flexion.  Patient is nontender along the spinous process to percussion.  Patient has mild left and right paravertebral muscle tenderness.  Patient has full range of motion of lower extremities on the hips and knees with no discomfort.  She is neurovascular intact in bilateral lower extremities.  Neurological: She is alert and oriented to person, place, and time. She has normal reflexes.  Skin: Skin is warm and dry.  Psychiatric: She has a normal mood and affect. Her behavior is normal. Thought content normal.     ED Treatments / Results  Labs (all labs ordered are listed, but only abnormal results are displayed) Labs Reviewed - No data to display  EKG None  Radiology No results found.  Procedures Procedures (including critical care time)  Medications Ordered in ED Medications - No  data to display   Initial Impression / Assessment and Plan / ED Course  I have reviewed the triage vital signs and the nursing notes.  Pertinent labs & imaging results that were available during my care of the patient were reviewed by me and considered in my medical decision making (see chart for details).     52 year old female with nontraumatic lower back pain after lifting heavy boxes.  Physical exam and history consistent with lower lumbar strain with no radicular symptoms and she has no neurological deficits.  She will use ibuprofen and Flexeril as needed.  She is given a note to avoid work today to avoid heavy lifting.  Patient also with history of hypertension with no treatment, she is slightly hypertensive today with no symptoms of chest pain, headache or vision changes.  She has scheduled an appointment to establish care with PCP but this is not until October.  We will go ahead and start patient on hydrochlorothiazide 12.5 mg daily.  Patient will continue to monitor and record twice daily blood pressure readings.  She understands signs and symptoms to  discontinue blood pressure medication as well as to return to the emergency department for.  Final Clinical Impressions(s) / ED Diagnoses   Final diagnoses:  Strain of lumbar region, initial encounter  Hypertension, unspecified type    ED Discharge Orders         Ordered    hydrochlorothiazide (HYDRODIURIL) 12.5 MG tablet  Daily     07/15/18 0951    cyclobenzaprine (FLEXERIL) 5 MG tablet  3 times daily PRN     07/15/18 0951    ibuprofen (ADVIL,MOTRIN) 600 MG tablet  Every 6 hours PRN     07/15/18 0951           Evon Slack, PA-C 07/15/18 0981    Pershing Proud Myra Rude, MD 07/15/18 1447

## 2018-07-15 NOTE — ED Triage Notes (Signed)
First RN: Pt presents to ED via POV with c/o lower back pain, pt states she believes that she pulled a muscle last night at work. Pt endorses repeated lifting motion at work. Pt ambulatory with steady gait. NAD noted at this time.

## 2018-07-15 NOTE — Discharge Instructions (Addendum)
Please use Flexeril and ibuprofen as needed for lower back pain and tightness.  If any increasing pain, weakness, loss of bowel or bladder symptoms return to the emergency department.  You may use a heating pad for additional pain relief.  Avoid any heavy lifting pushing or pulling until symptoms resolve.  Please start blood pressure medicine, hydrochlorothiazide daily.  Continue to monitor blood pressure daily and record your readings.  Return to the emergency department for any urgent changes in your health.  Follow-up with PCP in October.

## 2018-08-24 ENCOUNTER — Ambulatory Visit: Payer: Self-pay | Admitting: Family

## 2018-08-24 DIAGNOSIS — Z0289 Encounter for other administrative examinations: Secondary | ICD-10-CM

## 2018-12-08 ENCOUNTER — Other Ambulatory Visit: Payer: Self-pay

## 2018-12-08 ENCOUNTER — Emergency Department: Payer: Self-pay

## 2018-12-08 ENCOUNTER — Encounter: Payer: Self-pay | Admitting: Emergency Medicine

## 2018-12-08 ENCOUNTER — Emergency Department
Admission: EM | Admit: 2018-12-08 | Discharge: 2018-12-08 | Disposition: A | Discharge status: Home / Self Care | Payer: Self-pay | Attending: Emergency Medicine | Admitting: Emergency Medicine

## 2018-12-08 DIAGNOSIS — J069 Acute upper respiratory infection, unspecified: Secondary | ICD-10-CM | POA: Insufficient documentation

## 2018-12-08 DIAGNOSIS — F17210 Nicotine dependence, cigarettes, uncomplicated: Secondary | ICD-10-CM | POA: Insufficient documentation

## 2018-12-08 DIAGNOSIS — Z79899 Other long term (current) drug therapy: Secondary | ICD-10-CM | POA: Insufficient documentation

## 2018-12-08 DIAGNOSIS — I10 Essential (primary) hypertension: Secondary | ICD-10-CM | POA: Insufficient documentation

## 2018-12-08 DIAGNOSIS — B9789 Other viral agents as the cause of diseases classified elsewhere: Secondary | ICD-10-CM

## 2018-12-08 HISTORY — DX: Essential (primary) hypertension: I10

## 2018-12-08 LAB — INFLUENZA PANEL BY PCR (TYPE A & B)
INFLAPCR: NEGATIVE
Influenza B By PCR: NEGATIVE

## 2018-12-08 MED ORDER — PSEUDOEPH-BROMPHEN-DM 30-2-10 MG/5ML PO SYRP: 5.0000 mL | ORAL_SOLUTION | Freq: Four times a day (QID) | ORAL | 0 refills | Status: DC | PRN

## 2018-12-08 MED ORDER — HYDROCOD POLST-CPM POLST ER 10-8 MG/5ML PO SUER
5.0000 mL | Freq: Once | ORAL | Status: AC
  Administered 2018-12-08: 5 mL via ORAL
  Filled 2018-12-08: qty 5

## 2018-12-08 NOTE — ED Triage Notes (Signed)
Cough x 2 days

## 2018-12-08 NOTE — ED Provider Notes (Signed)
[facility] Emergency Department Provider Note   ____________________________________________   First MD Initiated Contact with Patient 12/08/18 [phone]     (approximate)  I have reviewed the triage vital signs and the nursing notes.   HISTORY  Chief Complaint Cough    HPI Carrie Mcdaniel is a 53 y.o. female patient complain of productive cough for 2 days.  Patient also state nasal congestion, sore throat, frontal headache, and body aches.  Patient had diarrhea.  Patient denies nausea and vomiting.  Patient rates pain as a 6/10.  Patient described the pain as "achy".  Patient not taken flu shot for this season.  No palliative measure for complaint.  Past Medical History:  Diagnosis Date  . Hypertension     There are no active problems to display for this patient.   Past Surgical History:  Procedure Laterality Date  . APPENDECTOMY      Prior to Admission medications   Medication Sig Start Date End Date Taking? Authorizing Provider  albuterol (PROVENTIL HFA;VENTOLIN HFA) 108 (90 Base) MCG/ACT inhaler Inhale 2 puffs into the lungs every 6 (six) hours as needed for wheezing or shortness of breath. Patient not taking: Reported on 08/24/2017 06/25/17   Enid DerryWagner, Ashley, PA-C  Aspirin-Salicylamide-Caffeine (BC HEADACHE POWDER PO) Take 1 packet by mouth every 8 (eight) hours as needed (HA).    [provider]  azithromycin (ZITHROMAX Z-PAK) 250 MG tablet Take 2 tablets (500 mg) on  Day 1,  followed by 1 tablet (250 mg) once daily on Days 2 through 5. Patient not taking: Reported on 08/24/2017 06/25/17   Enid DerryWagner, Ashley, PA-C  brompheniramine-pseudoephedrine-DM 30-2-10 MG/5ML syrup Take 5 mLs by mouth 4 (four) times daily as needed. 12/08/18   Joni ReiningSmith, Majesti Gambrell K, PA-C  cyclobenzaprine (FLEXERIL) 5 MG tablet Take 1-2 tablets (5-10 mg total) by mouth 3 (three) times daily as needed for muscle spasms. 07/15/18   Evon SlackGaines, Thomas C, PA-C  hydrochlorothiazide  (HYDRODIURIL) 12.5 MG tablet Take 1 tablet (12.5 mg total) by mouth daily. 07/15/18   Evon SlackGaines, Thomas C, PA-C  ibuprofen (ADVIL,MOTRIN) 600 MG tablet Take 1 tablet (600 mg total) by mouth every 6 (six) hours as needed for moderate pain. 07/15/18   Evon SlackGaines, Thomas C, PA-C  ondansetron (ZOFRAN) 4 MG tablet Take 1 tablet (4 mg total) by mouth daily as needed for nausea or vomiting. 01/13/18 01/13/19  Enid DerryWagner, Ashley, PA-C  predniSONE (DELTASONE) 20 MG tablet 3 tabs po day one, then 2 po daily x 4 days 08/24/17   Rolan BuccoBelfi, Melanie, MD    Allergies Penicillins  No family history on file.  Social History Social History   Tobacco Use  . Smoking status: Current Every Day Smoker    Packs/day: 1.00    Types: Cigarettes  . Smokeless tobacco: Never Used  Substance Use Topics  . Alcohol use: Yes  . Drug use: No    Review of Systems Constitutional: No fever/chills.  Body aches. Eyes: No visual changes. ENT: Throat.  Nasal congestion and rhinorrhea. Cardiovascular: Denies chest pain. Respiratory: Denies shortness of breath.  Productive cough. Gastrointestinal: No abdominal pain.  No nausea, no vomiting.  No diarrhea.  No constipation. Genitourinary: Negative for dysuria. Musculoskeletal: Negative for back pain. Skin: Negative for rash. Neurological: Negative for headaches, focal weakness or numbness. Endocrine:Hypertension Allergic/Immunilogical: Penicillin.  ____________________________________________   PHYSICAL EXAM:  VITAL SIGNS: ED Triage Vitals [12/08/18 0844]  Enc Vitals Group     BP (!) 152/94     Pulse Rate  65     Resp 20     Temp 97.6 F (36.4 C)     Temp Source Oral     SpO2 100 %     Weight 145 lb (65.8 kg)     Height 5\' 8"  (1.727 m)     Head Circumference      Peak Flow      Pain Score 6     Pain Loc      Pain Edu?      Excl. in GC?    Constitutional: Alert and oriented. Well appearing and in no acute distress. Eyes: Conjunctivae are normal. PERRL. EOMI. Head:  Atraumatic. Nose: Edematous nasal turbinates clear rhinorrhea.   Mouth/Throat: Mucous membranes are moist.  Oropharynx non-erythematous.  Postnasal drainage. Neck: No stridor.  Hematological/Lymphatic/Immunilogical: No cervical lymphadenopathy. Cardiovascular: Normal rate, regular rhythm. Grossly normal heart sounds.  Good peripheral circulation.  Elevated blood pressure. Respiratory: Normal respiratory effort.  No retractions. Lungs CTAB. Gastrointestinal: Soft and nontender. No distention. No abdominal bruits. No CVA tenderness. Musculoskeletal: No lower extremity tenderness nor edema.  No joint effusions. Neurologic:  Normal speech and language. No gross focal neurologic deficits are appreciated. No gait instability. Skin:  Skin is warm, dry and intact. No rash noted. Psychiatric: Mood and affect are normal. Speech and behavior are normal.  ____________________________________________   LABS (all labs ordered are listed, but only abnormal results are displayed)  Labs Reviewed  INFLUENZA PANEL BY PCR (TYPE A & B)   ____________________________________________  EKG   ____________________________________________  RADIOLOGY  ED MD interpretation:    Official radiology report(s): Dg Chest 2 View  Result Date: 12/08/2018 CLINICAL DATA:  Cough for 2 days EXAM: CHEST - 2 VIEW COMPARISON:  June 25, 2017 FINDINGS: There is no appreciable edema or consolidation. The heart size and pulmonary vascularity are normal. No adenopathy. No bone lesions. IMPRESSION: No edema or consolidation. Electronically Signed   By: Bretta BangWilliam  Woodruff III M.D.   On: 12/08/2018 09:13    ____________________________________________   PROCEDURES  Procedure(s) performed:   Procedures  Critical Care performed:   ____________________________________________   INITIAL IMPRESSION / ASSESSMENT AND PLAN / ED COURSE  As part of my medical decision making, I reviewed the following data within the  electronic MEDICAL RECORD NUMBER    Patient presents with 2 days of productive cough.  Discussed negative x-ray and flu results with patient.  Patient diagnosed with viral respiratory infection with cough.  Patient given discharge care instruction work note.  Patient advised follow-up [facility] condition persist.      ____________________________________________   FINAL CLINICAL IMPRESSION(S) / ED DIAGNOSES  Final diagnoses:  Viral URI with cough     ED Discharge Orders         Ordered    brompheniramine-pseudoephedrine-DM 30-2-10 MG/5ML syrup  4 times daily PRN     12/08/18 0956           Note:  This document was prepared using Dragon voice recognition software and may include unintentional dictation errors.    Joni ReiningSmith, Deneane Stifter K, PA-C 12/08/18 1000    Schaevitz, Myra Rudeavid Matthew, MD 12/08/18 1352

## 2018-12-08 NOTE — ED Notes (Signed)
See triage note  Presents with cough for the past 2 days  No fever but states cough has been prod at times  Afebrile on arrival

## 2019-05-29 ENCOUNTER — Emergency Department (HOSPITAL_COMMUNITY)
Admission: EM | Admit: 2019-05-29 | Discharge: 2019-05-29 | Disposition: A | Discharge status: Home / Self Care | Payer: Self-pay | Attending: Emergency Medicine | Admitting: Emergency Medicine

## 2019-05-29 ENCOUNTER — Encounter (HOSPITAL_COMMUNITY): Payer: Self-pay

## 2019-05-29 ENCOUNTER — Other Ambulatory Visit: Payer: Self-pay

## 2019-05-29 ENCOUNTER — Emergency Department (HOSPITAL_COMMUNITY): Payer: Self-pay

## 2019-05-29 DIAGNOSIS — Z880 Allergy status to penicillin: Secondary | ICD-10-CM | POA: Insufficient documentation

## 2019-05-29 DIAGNOSIS — I10 Essential (primary) hypertension: Secondary | ICD-10-CM | POA: Insufficient documentation

## 2019-05-29 DIAGNOSIS — J069 Acute upper respiratory infection, unspecified: Secondary | ICD-10-CM | POA: Insufficient documentation

## 2019-05-29 DIAGNOSIS — F17210 Nicotine dependence, cigarettes, uncomplicated: Secondary | ICD-10-CM | POA: Insufficient documentation

## 2019-05-29 DIAGNOSIS — Z20828 Contact with and (suspected) exposure to other viral communicable diseases: Secondary | ICD-10-CM | POA: Insufficient documentation

## 2019-05-29 DIAGNOSIS — Z79899 Other long term (current) drug therapy: Secondary | ICD-10-CM | POA: Insufficient documentation

## 2019-05-29 MED ORDER — BENZONATATE 100 MG PO CAPS: 100.0000 mg | ORAL_CAPSULE | Freq: Three times a day (TID) | ORAL | 0 refills | Status: DC | PRN

## 2019-05-29 NOTE — ED Triage Notes (Signed)
She reports persistent cough/congestion/uri sx x 1 1/2 weeks. She tells [place] "I've been wtill going to work because they check [place] everyday for fever and I haven't had one; but this just keeps getting a little worse". She is ambulatory and in no distress. She c/o some nausea, but denies any emesis.

## 2019-05-29 NOTE — ED Provider Notes (Signed)
[facility]-EMERGENCY DEPT Provider Note   CSN: [id number] Arrival date & time: 05/29/19  [id number]    History   Chief Complaint Chief Complaint  Patient presents with  . URI    HPI Carrie Mcdaniel is a 53 y.o. female with history of hypertension presents for evaluation of acute onset, persistent nasal congestion, mild sore throat, productive cough for 1 week.  Reports cough is productive of clear sputum.  Denies shortness of breath, chest pain, or fevers.  She has had some watery nonbloody loose stools but denies abdominal pain, nausea, or vomiting.  She has been taking Tylenol, Advil, Pepto-Bismol with some relief in symptoms.  She works at [employer] and was told to self quarantine at home beginning yesterday in the midst of the COVID-19 pandemic.  She notes no known sick contacts but reports [place]  She is a current smoker of approximately pack of cigarettes daily, denies recreational drug use, occasionally drinks alcohol.     The history is provided by the patient.    Past Medical History:  Diagnosis Date  . Hypertension     There are no active problems to display for this patient.   Past Surgical History:  Procedure Laterality Date  . APPENDECTOMY       OB History   No obstetric history on file.      Home Medications    Prior to Admission medications   Medication Sig Start Date End Date Taking? Authorizing Provider  albuterol (PROVENTIL HFA;VENTOLIN HFA) 108 (90 Base) MCG/ACT inhaler Inhale 2 puffs into the lungs every 6 (six) hours as needed for wheezing or shortness of breath. Patient not taking: Reported on 08/24/2017 06/25/17   Enid DerryWagner, Ashley, PA-C  Aspirin-Salicylamide-Caffeine (BC HEADACHE POWDER PO) Take 1 packet by mouth every 8 (eight) hours as needed (HA).    [provider]  azithromycin (ZITHROMAX Z-PAK) 250 MG tablet Take 2 tablets (500 mg) on  Day 1,  followed by 1 tablet  (250 mg) once daily on Days 2 through 5. Patient not taking: Reported on 08/24/2017 06/25/17   Enid DerryWagner, Ashley, PA-C  benzonatate (TESSALON) 100 MG capsule Take 1 capsule (100 mg total) by mouth 3 (three) times daily as needed for cough. 05/29/19   Keajah Killough A, PA-C  brompheniramine-pseudoephedrine-DM 30-2-10 MG/5ML syrup Take 5 mLs by mouth 4 (four) times daily as needed. 12/08/18   Joni ReiningSmith, Ronald K, PA-C  cyclobenzaprine (FLEXERIL) 5 MG tablet Take 1-2 tablets (5-10 mg total) by mouth 3 (three) times daily as needed for muscle spasms. 07/15/18   Evon SlackGaines, Thomas C, PA-C  hydrochlorothiazide (HYDRODIURIL) 12.5 MG tablet Take 1 tablet (12.5 mg total) by mouth daily. 07/15/18   Evon SlackGaines, Thomas C, PA-C  ibuprofen (ADVIL,MOTRIN) 600 MG tablet Take 1 tablet (600 mg total) by mouth every 6 (six) hours as needed for moderate pain. 07/15/18   Evon SlackGaines, Thomas C, PA-C  predniSONE (DELTASONE) 20 MG tablet 3 tabs po day one, then 2 po daily x 4 days 08/24/17   Rolan BuccoBelfi, Melanie, MD    Family History No family history on file.  Social History Social History   Tobacco Use  . Smoking status: Current Every Day Smoker    Packs/day: 1.00    Types: Cigarettes  . Smokeless tobacco: Never Used  Substance Use Topics  . Alcohol use: Yes  . Drug use: No     Allergies   Penicillins   Review of Systems Review of  Systems  Constitutional: Negative for chills and fever.  HENT: Positive for congestion and sore throat.   Respiratory: Positive for cough. Negative for shortness of breath.   Cardiovascular: Negative for chest pain.  Gastrointestinal: Positive for diarrhea. Negative for abdominal pain, blood in stool, nausea and vomiting.  All other systems reviewed and are negative.    Physical Exam Updated Vital Signs BP (!) 144/90 (BP Location: Left Arm)   Pulse 65   Temp 98.3 F (36.8 C) (Oral)   Resp 18   SpO2 99%   Physical Exam Vitals signs and nursing note reviewed.  Constitutional:      General: She is  not in acute distress.    Appearance: Normal appearance. She is well-developed.     Comments: Resting comfortably in bed.   HENT:     Head: Normocephalic and atraumatic.     Right Ear: External ear normal.     Left Ear: External ear normal.     Nose: Congestion present.     Mouth/Throat:     Mouth: Mucous membranes are moist.     Pharynx: Oropharynx is clear.     Comments: Mild posterior pharyngeal erythema, no tonsillar hypertrophy, no exudates, no uvular deviation.  No trismus or sublingual abnormalities.  Tolerating secretions without difficulty. Eyes:     General:        Right eye: No discharge.        Left eye: No discharge.     Conjunctiva/sclera: Conjunctivae normal.  Neck:     Musculoskeletal: Normal range of motion and neck supple. No neck rigidity.     Vascular: No JVD.     Trachea: No tracheal deviation.  Cardiovascular:     Rate and Rhythm: Normal rate and regular rhythm.     Heart sounds: Normal heart sounds.  Pulmonary:     Effort: Pulmonary effort is normal.     Breath sounds: Normal breath sounds.  Abdominal:     General: Abdomen is flat. Bowel sounds are normal. There is no distension.     Palpations: Abdomen is soft.     Tenderness: There is no abdominal tenderness. There is no guarding or rebound.  Lymphadenopathy:     Cervical: No cervical adenopathy.  Skin:    General: Skin is warm and dry.     Findings: No erythema.  Neurological:     Mental Status: She is alert.  Psychiatric:        Behavior: Behavior normal.      ED Treatments / Results  Labs (all labs ordered are listed, but only abnormal results are displayed) Labs Reviewed  NOVEL CORONAVIRUS, NAA (HOSPITAL ORDER, SEND-OUT TO REF LAB)    EKG None  Radiology Dg Chest Portable 1 View  Result Date: 05/29/2019 CLINICAL DATA:  Increased cough, no fever EXAM: PORTABLE CHEST 1 VIEW COMPARISON:  December 08, 2018 FINDINGS: The heart size and mediastinal contours are within normal limits. Both  lungs are clear. The visualized skeletal structures are unremarkable. IMPRESSION: No active disease. Electronically Signed   By: Sherian ReinWei-Chen  Lin M.D.   On: 05/29/2019 08:29    Procedures Procedures (including critical care time)  Medications Ordered in ED Medications - No data to display   Initial Impression / Assessment and Plan / ED Course  I have reviewed the triage vital signs and the nursing notes.  Pertinent labs & imaging results that were available during my care of the patient were reviewed by me and considered in my medical decision making (  see chart for details).        OTTILIA PIPPENGER was evaluated in Emergency Department on 05/29/2019 for the symptoms described in the history of present illness. She was evaluated in the context of the global COVID-19 pandemic, which necessitated consideration that the patient might be at risk for infection with the SARS-CoV-2 virus that causes COVID-19. Institutional protocols and algorithms that pertain to the evaluation of patients at risk for COVID-19 are in a state of rapid change based on information released by regulatory bodies including the CDC and federal and state organizations. These policies and algorithms were followed during the patient's care in the ED.  Patient presents for evaluation of nasal congestion, cough, diarrhea for the last week or so.  She is afebrile, vital signs are stable.  She is nontoxic in appearance.  SPO2 saturations stable on room air and she has no increased work of breathing.  Lungs are clear to auscultation bilaterally and chest x-ray shows no evidence of acute cardiopulmonary abnormalities including pneumonia or pleural effusion.  She has no fever or nuchal rigidity or headache to suggest meningitis.  Posterior oropharynx examined with no evidence of strep pharyngitis, PTA, no concern for deep space neck infection.  Likely viral URI.  In the midst of the COVID-19 pandemic, we will obtain send out test.  She  understands that this will result within 48 hours.  Discussed home quarantine.  Discussed symptomatic management.  Recommend follow-up with PCP if symptoms persist.  Discussed strict ED return precautions. Patient verbalized understanding of and agreement with plan and is safe for discharge home at this time.   Final Clinical Impressions(s) / ED Diagnoses   Final diagnoses:  Viral URI with cough    ED Discharge Orders         Ordered    benzonatate (TESSALON) 100 MG capsule  3 times daily PRN     05/29/19 0834           Renita Papa, PA-C 05/29/19 4270    Daleen Bo, MD 05/29/19 1810

## 2019-05-29 NOTE — Discharge Instructions (Addendum)
Your chest x-ray today was normal with no signs of pneumonia or fluid in the lungs.  Drink plenty fluids and plenty of rest.  You can continue taking ibuprofen or Tylenol as needed for aches pains or fevers.  Take Tessalon as needed for cough.  Your COVID test will result in 48 hours.  You can see your results on my chart.  If you are not set up for my chart, follow the instructions in this packet.  You should quarantine at home for at least 10 days from your symptom onset and 3 days fever free without the use of ibuprofen or Tylenol.  Based on current recommendations from the [facility]. See attached instructions for how to quarantine.   Return to the emergency department if any concerning signs or symptoms develop such as shortness of breath, chest pains, persistent vomiting, or loss of consciousness.

## 2019-05-29 NOTE — ED Notes (Signed)
Portable Chest X-ray at bedside

## 2019-05-30 LAB — NOVEL CORONAVIRUS, NAA (HOSP ORDER, SEND-OUT TO REF LAB; TAT 18-24 HRS): SARS-CoV-2, NAA: NOT DETECTED

## 2019-06-03 ENCOUNTER — Other Ambulatory Visit: Payer: Self-pay

## 2019-06-03 ENCOUNTER — Emergency Department (HOSPITAL_COMMUNITY)
Admission: EM | Admit: 2019-06-03 | Discharge: 2019-06-03 | Disposition: A | Discharge status: Home / Self Care | Payer: Self-pay | Attending: Emergency Medicine | Admitting: Emergency Medicine

## 2019-06-03 ENCOUNTER — Encounter (HOSPITAL_COMMUNITY): Payer: Self-pay | Admitting: Emergency Medicine

## 2019-06-03 DIAGNOSIS — I10 Essential (primary) hypertension: Secondary | ICD-10-CM | POA: Insufficient documentation

## 2019-06-03 DIAGNOSIS — F17210 Nicotine dependence, cigarettes, uncomplicated: Secondary | ICD-10-CM | POA: Insufficient documentation

## 2019-06-03 DIAGNOSIS — Z7189 Other specified counseling: Secondary | ICD-10-CM | POA: Insufficient documentation

## 2019-06-03 DIAGNOSIS — Z880 Allergy status to penicillin: Secondary | ICD-10-CM | POA: Insufficient documentation

## 2019-06-03 NOTE — ED Triage Notes (Signed)
Pt reports that she was seen here July 8, has note out of work until July 21, her Covid test was negative so she needs a note to return to work.

## 2019-06-03 NOTE — ED Provider Notes (Signed)
[facility]-EMERGENCY DEPT Provider Note   CSN: [id number] Arrival date & time: 06/03/19  [id number]     History   Chief Complaint Chief Complaint  Patient presents with  . needs note to return to work    HPI Carrie Mcdaniel is a 53 y.o. female.     The history is provided by the patient and medical records. No language interpreter was used.   Carrie Mcdaniel is a 53 y.o. female who presents to the Emergency Department feeling well and wanting to go back to work.  She was seen in the emergency department 5 days ago for nasal congestion, cough.  Chest x-ray was done which was negative.  She had a send out coronavirus test which also resulted negative.  She had a work note that had her going back to work on July 28.  She states that she now feels better and would like to return sooner given her test was negative.  She has not had any fevers.  She has a baseline smoker's cough, but reports that her cough is now as usual with no acute worsening.  No shortness of breath.  Feels much better.  She is not taking any medications at home to alleviate symptoms.  Past Medical History:  Diagnosis Date  . Hypertension     There are no active problems to display for this patient.   Past Surgical History:  Procedure Laterality Date  . APPENDECTOMY       OB History   No obstetric history on file.      Home Medications    Prior to Admission medications   Medication Sig Start Date End Date Taking? Authorizing Provider  albuterol (PROVENTIL HFA;VENTOLIN HFA) 108 (90 Base) MCG/ACT inhaler Inhale 2 puffs into the lungs every 6 (six) hours as needed for wheezing or shortness of breath. Patient not taking: Reported on 08/24/2017 06/25/17   Enid DerryWagner, Ashley, PA-C  Aspirin-Salicylamide-Caffeine (BC HEADACHE POWDER PO) Take 1 packet by mouth every 8 (eight) hours as needed (HA).    [provider]  azithromycin (ZITHROMAX Z-PAK) 250 MG tablet Take 2 tablets (500 mg) on   Day 1,  followed by 1 tablet (250 mg) once daily on Days 2 through 5. Patient not taking: Reported on 08/24/2017 06/25/17   Enid DerryWagner, Ashley, PA-C  benzonatate (TESSALON) 100 MG capsule Take 1 capsule (100 mg total) by mouth 3 (three) times daily as needed for cough. 05/29/19   Fawze, Mina A, PA-C  brompheniramine-pseudoephedrine-DM 30-2-10 MG/5ML syrup Take 5 mLs by mouth 4 (four) times daily as needed. 12/08/18   Joni ReiningSmith, Ronald K, PA-C  cyclobenzaprine (FLEXERIL) 5 MG tablet Take 1-2 tablets (5-10 mg total) by mouth 3 (three) times daily as needed for muscle spasms. 07/15/18   Evon SlackGaines, Thomas C, PA-C  hydrochlorothiazide (HYDRODIURIL) 12.5 MG tablet Take 1 tablet (12.5 mg total) by mouth daily. 07/15/18   Evon SlackGaines, Thomas C, PA-C  ibuprofen (ADVIL,MOTRIN) 600 MG tablet Take 1 tablet (600 mg total) by mouth every 6 (six) hours as needed for moderate pain. 07/15/18   Evon SlackGaines, Thomas C, PA-C  predniSONE (DELTASONE) 20 MG tablet 3 tabs po day one, then 2 po daily x 4 days 08/24/17   Rolan BuccoBelfi, Melanie, MD    Family History No family history on file.  Social History Social History   Tobacco Use  . Smoking status: Current Every Day Smoker    Packs/day: 1.00    Types: Cigarettes  . Smokeless tobacco: Never Used  Substance Use Topics  . Alcohol use: Yes  . Drug use: No     Allergies   Penicillins   Review of Systems Review of Systems  Constitutional: Negative for fever.  HENT: Negative for congestion.   Respiratory: Positive for cough (Now at baseline). Negative for shortness of breath and wheezing.   Cardiovascular: Negative for chest pain.  Gastrointestinal: Negative for abdominal pain, diarrhea, nausea and vomiting.     Physical Exam Updated Vital Signs BP (!) 131/92 (BP Location: Right Arm)   Pulse 60   Temp 98.4 F (36.9 C) (Oral)   Resp 17   SpO2 100%   Physical Exam Vitals signs and nursing note reviewed.  Constitutional:      General: She is not in acute distress.    Appearance:  She is well-developed. She is not ill-appearing or toxic-appearing.  HENT:     Head: Normocephalic and atraumatic.  Neck:     Musculoskeletal: Neck supple.  Cardiovascular:     Rate and Rhythm: Normal rate and regular rhythm.     Heart sounds: Normal heart sounds. No murmur.  Pulmonary:     Effort: Pulmonary effort is normal. No respiratory distress.     Breath sounds: Normal breath sounds.  Abdominal:     General: There is no distension.     Palpations: Abdomen is soft.     Tenderness: There is no abdominal tenderness.  Skin:    General: Skin is warm and dry.  Neurological:     Mental Status: She is alert and oriented to person, place, and time.      ED Treatments / Results  Labs (all labs ordered are listed, but only abnormal results are displayed) Labs Reviewed - No data to display  EKG None  Radiology No results found.  Procedures Procedures (including critical care time)  Medications Ordered in ED Medications - No data to display   Initial Impression / Assessment and Plan / ED Course  I have reviewed the triage vital signs and the nursing notes.  Pertinent labs & imaging results that were available during my care of the patient were reviewed by me and considered in my medical decision making (see chart for details).       Carrie Mcdaniel is a 53 y.o. female who presents to ED requesting work note.  Chart reviewed from previous encounter.  She was seen on July 8 for URI symptoms.  She had an outpatient COVID test which resulted negative as well as a chest x-ray without any signs of pneumonia.  She feels improved and would like to return to work.  She has remained afebrile.  Symptoms resolved.  Feel it is reasonable to get her back to work.  Recommended that she wear a mask at all times during work and practice good Comptroller / social distancing. She agrees. PCP follow up if needed.  Note provided and all questions answered.  Carrie Mcdaniel was evaluated  in Emergency Department on 06/03/2019 for the symptoms described in the history of present illness. She was evaluated in the context of the global COVID-19 pandemic, which necessitated consideration that the patient might be at risk for infection with the SARS-CoV-2 virus that causes COVID-19. Institutional protocols and algorithms that pertain to the evaluation of patients at risk for COVID-19 are in a state of rapid change based on information released by regulatory bodies including the CDC and federal and state organizations. These policies and algorithms were followed during the patient's care  in the ED.   Final Clinical Impressions(s) / ED Diagnoses   Final diagnoses:  Educated About Covid-19 Virus Infection    ED Discharge Orders    None       Robinette Esters, Chase PicketJaime Pilcher, PA-C 06/03/19 [id number]    Mancel BaleWentz, Elliott, MD 06/03/19 1428

## 2019-06-03 NOTE — ED Notes (Signed)
ED Provider at bedside. 

## 2019-08-30 ENCOUNTER — Encounter: Payer: Self-pay | Admitting: Emergency Medicine

## 2019-08-30 ENCOUNTER — Emergency Department
Admission: EM | Admit: 2019-08-30 | Discharge: 2019-08-30 | Disposition: A | Discharge status: Home / Self Care | Payer: Self-pay | Attending: Student | Admitting: Student

## 2019-08-30 ENCOUNTER — Other Ambulatory Visit: Payer: Self-pay

## 2019-08-30 DIAGNOSIS — R519 Headache, unspecified: Secondary | ICD-10-CM

## 2019-08-30 DIAGNOSIS — F17210 Nicotine dependence, cigarettes, uncomplicated: Secondary | ICD-10-CM | POA: Insufficient documentation

## 2019-08-30 DIAGNOSIS — J0100 Acute maxillary sinusitis, unspecified: Secondary | ICD-10-CM | POA: Insufficient documentation

## 2019-08-30 DIAGNOSIS — I10 Essential (primary) hypertension: Secondary | ICD-10-CM | POA: Insufficient documentation

## 2019-08-30 MED ORDER — CEPHALEXIN 500 MG PO CAPS: 500.0000 mg | ORAL_CAPSULE | Freq: Three times a day (TID) | ORAL | 0 refills | Status: AC

## 2019-08-30 MED ORDER — FLUTICASONE PROPIONATE 50 MCG/ACT NA SUSP: 2.0000 | Freq: Every day | NASAL | 0 refills | Status: DC

## 2019-08-30 MED ORDER — LISINOPRIL 10 MG PO TABS: 10.0000 mg | ORAL_TABLET | Freq: Every day | ORAL | 11 refills | Status: DC

## 2019-08-30 NOTE — ED Notes (Signed)
See triage note  Presents with frontal headache  Pressure behind and into ears  afebrile

## 2019-08-30 NOTE — ED Provider Notes (Signed)
[facility] Emergency Department Provider Note  ____________________________________________   First MD Initiated Contact with Patient 08/30/19 1020     (approximate)  I have reviewed the triage vital signs and the nursing notes.   HISTORY  Chief Complaint Headache    HPI Carrie Mcdaniel is a 53 y.o. female presents emergency department complaint of sinus headache and congestion.  She states she is also have high blood pressure and does like to take blood pressure medicine as it makes her urinate all day long.  They have given her hydrochlorothiazide.  She denies any fever chills.  No cough or congestion.  No vomiting or diarrhea.  Symptoms for over 3 weeks.  Was seen at the start of the symptoms and was given a COVID test which was negative.    Past Medical History:  Diagnosis Date  . Hypertension     There are no active problems to display for this patient.   Past Surgical History:  Procedure Laterality Date  . APPENDECTOMY      Prior to Admission medications   Medication Sig Start Date End Date Taking? Authorizing Provider  Aspirin-Salicylamide-Caffeine (BC HEADACHE POWDER PO) Take 1 packet by mouth every 8 (eight) hours as needed (HA).    [provider]  cephALEXin (KEFLEX) 500 MG capsule Take 1 capsule (500 mg total) by mouth 3 (three) times daily for 10 days. 08/30/19 09/09/19  Sidharth Leverette, Roselyn BeringSusan W, PA-C  fluticasone (FLONASE) 50 MCG/ACT nasal spray Place 2 sprays into both nostrils daily. 08/30/19 08/29/20  Anajulia Leyendecker, Roselyn BeringSusan W, PA-C  lisinopril (ZESTRIL) 10 MG tablet Take 1 tablet (10 mg total) by mouth daily. 08/30/19 08/29/20  Samira Acero, Roselyn BeringSusan W, PA-C  albuterol (PROVENTIL HFA;VENTOLIN HFA) 108 (90 Base) MCG/ACT inhaler Inhale 2 puffs into the lungs every 6 (six) hours as needed for wheezing or shortness of breath. Patient not taking: Reported on 08/24/2017 06/25/17 08/30/19  Enid DerryWagner, Ashley, PA-C  hydrochlorothiazide (HYDRODIURIL) 12.5 MG tablet  Take 1 tablet (12.5 mg total) by mouth daily. 07/15/18 08/30/19  Evon SlackGaines, Thomas C, PA-C    Allergies Penicillins  No family history on file.  Social History Social History   Tobacco Use  . Smoking status: Current Every Day Smoker    Packs/day: 1.00    Types: Cigarettes  . Smokeless tobacco: Never Used  Substance Use Topics  . Alcohol use: Yes  . Drug use: No    Review of Systems  Constitutional: No fever/chills Eyes: No visual changes. ENT: No sore throat.  Positive sinus congestion, sinus headache, and nasal drainage Respiratory: Denies cough Genitourinary: Negative for dysuria. Musculoskeletal: Negative for back pain. Skin: Negative for rash.    ____________________________________________   PHYSICAL EXAM:  VITAL SIGNS: ED Triage Vitals [08/30/19 0942]  Enc Vitals Group     BP (!) 180/99     Pulse Rate 70     Resp 16     Temp 98.6 F (37 C)     Temp Source Oral     SpO2 98 %     Weight 145 lb (65.8 kg)     Height 5\' 8"  (1.727 m)     Head Circumference      Peak Flow      Pain Score 7     Pain Loc      Pain Edu?      Excl. in GC?     Constitutional: Alert and oriented. Well appearing and in no acute distress. Eyes: Conjunctivae are normal.  Head: Atraumatic.  Nose: No congestion/rhinnorhea.  He consumes red swollen Mouth/Throat: Mucous membranes are moist.  Throat appears normal Neck:  supple no lymphadenopathy noted Cardiovascular: Normal rate, regular rhythm. Heart sounds are normal Respiratory: Normal respiratory effort.  No retractions, lungs c t a  GU: deferred Musculoskeletal: FROM all extremities, warm and well perfused Neurologic:  Normal speech and language.  Skin:  Skin is warm, dry and intact. No rash noted. Psychiatric: Mood and affect are normal. Speech and behavior are normal.  ____________________________________________   LABS (all labs ordered are listed, but only abnormal results are displayed)  Labs Reviewed - No data to  display ____________________________________________   ____________________________________________  RADIOLOGY    ____________________________________________   PROCEDURES  Procedure(s) performed: No  Procedures    ____________________________________________   INITIAL IMPRESSION / ASSESSMENT AND PLAN / ED COURSE  Pertinent labs & imaging results that were available during my care of the patient were reviewed by me and considered in my medical decision making (see chart for details).   Patient is 53 year old female presents emergency department with sinus headache, congestion.  Also with concerns of hypertension and does not like the current blood pressure medication she is on.  Physical exam shows patient's blood pressure is 180/99.  Some nasal congestion noted.  Remainder the exam is unremarkable  Explained findings to the patient.  She is given a prescription for lisinopril is to stop her HCTZ.  She was given a prescription for Keflex and Flonase.  She is to take these medications as prescribed.  Follow-up with her regular doctor or 1 of the discounted clinics if not better in 3 to 5 days.  Return emergency department worsening.  She is given a work note.  She is discharged stable condition.    Carrie Mcdaniel was evaluated in Emergency Department on 08/30/2019 for the symptoms described in the history of present illness. She was evaluated in the context of the global COVID-19 pandemic, which necessitated consideration that the patient might be at risk for infection with the SARS-CoV-2 virus that causes COVID-19. Institutional protocols and algorithms that pertain to the evaluation of patients at risk for COVID-19 are in a state of rapid change based on information released by regulatory bodies including the CDC and federal and state organizations. These policies and algorithms were followed during the patient's care in the ED.   As part of my medical decision making, I  reviewed the following data within the electronic MEDICAL RECORD NUMBER Nursing notes reviewed and incorporated, Old chart reviewed, Notes from prior ED visits and [place] Controlled Substance Database  ____________________________________________   FINAL CLINICAL IMPRESSION(S) / ED DIAGNOSES  Final diagnoses:  Acute maxillary sinusitis, recurrence not specified  Sinus headache  Essential hypertension      NEW MEDICATIONS STARTED DURING THIS VISIT:  Discharge Medication List as of 08/30/2019 10:29 AM    START taking these medications   Details  cephALEXin (KEFLEX) 500 MG capsule Take 1 capsule (500 mg total) by mouth 3 (three) times daily for 10 days., Starting Fri 08/30/2019, Until Mon 09/09/2019, Normal    fluticasone (FLONASE) 50 MCG/ACT nasal spray Place 2 sprays into both nostrils daily., Starting Fri 08/30/2019, Until Sat 08/29/2020, Normal    lisinopril (ZESTRIL) 10 MG tablet Take 1 tablet (10 mg total) by mouth daily., Starting Fri 08/30/2019, Until Sat 08/29/2020, Normal         Note:  This document was prepared using Dragon voice recognition software and may include unintentional dictation errors.  Versie Starks, PA-C 08/30/19 1116    Lilia Pro., MD 08/31/19 [phone]

## 2019-08-30 NOTE — ED Triage Notes (Signed)
PT report she is here for a sinus headache and right ear ache that started Tuesday.

## 2019-08-30 NOTE — Discharge Instructions (Signed)
Follow-up with your regular doctor or 1 of the discounted clinics.  Return to the emergency department if worsening.  Take medication as prescribed.  You may return to work on Sunday if you have been afebrile.

## 2020-09-15 ENCOUNTER — Emergency Department (HOSPITAL_COMMUNITY)
Admission: EM | Admit: 2020-09-15 | Discharge: 2020-09-15 | Disposition: A | Discharge status: Home / Self Care | Payer: HRSA Program | Attending: Emergency Medicine | Admitting: Emergency Medicine

## 2020-09-15 ENCOUNTER — Emergency Department (HOSPITAL_COMMUNITY): Payer: HRSA Program

## 2020-09-15 ENCOUNTER — Other Ambulatory Visit: Payer: Self-pay

## 2020-09-15 ENCOUNTER — Telehealth: Payer: Self-pay | Admitting: Nurse Practitioner

## 2020-09-15 ENCOUNTER — Encounter (HOSPITAL_COMMUNITY): Payer: Self-pay

## 2020-09-15 ENCOUNTER — Telehealth: Payer: Self-pay | Admitting: Physician Assistant

## 2020-09-15 DIAGNOSIS — R059 Cough, unspecified: Secondary | ICD-10-CM | POA: Diagnosis present

## 2020-09-15 DIAGNOSIS — Z79899 Other long term (current) drug therapy: Secondary | ICD-10-CM | POA: Diagnosis not present

## 2020-09-15 DIAGNOSIS — U071 COVID-19: Secondary | ICD-10-CM | POA: Diagnosis not present

## 2020-09-15 DIAGNOSIS — I10 Essential (primary) hypertension: Secondary | ICD-10-CM | POA: Insufficient documentation

## 2020-09-15 DIAGNOSIS — F17210 Nicotine dependence, cigarettes, uncomplicated: Secondary | ICD-10-CM | POA: Insufficient documentation

## 2020-09-15 LAB — COMPREHENSIVE METABOLIC PANEL
ALT: 96 U/L — ABNORMAL HIGH (ref 0–44)
AST: 122 U/L — ABNORMAL HIGH (ref 15–41)
Albumin: 3.9 g/dL (ref 3.5–5.0)
Alkaline Phosphatase: 108 U/L (ref 38–126)
Anion gap: 9 (ref 5–15)
BUN: 9 mg/dL (ref 6–20)
CO2: 24 mmol/L (ref 22–32)
Calcium: 8.5 mg/dL — ABNORMAL LOW (ref 8.9–10.3)
Chloride: 103 mmol/L (ref 98–111)
Creatinine, Ser: 0.56 mg/dL (ref 0.44–1.00)
GFR, Estimated: >60 mL/min (ref >60)
Glucose, Bld: 100 mg/dL — ABNORMAL HIGH (ref 70–99)
Potassium: 3.8 mmol/L (ref 3.5–5.1)
Sodium: 136 mmol/L (ref 135–145)
Total Bilirubin: 0.8 mg/dL (ref 0.3–1.2)
Total Protein: 6.8 g/dL (ref 6.5–8.1)

## 2020-09-15 LAB — CBC WITH DIFFERENTIAL/PLATELET
Abs Immature Granulocytes: 0.01 10*3/uL (ref 0.00–0.07)
Basophils Absolute: 0 10*3/uL (ref 0.0–0.1)
Basophils Relative: 1 %
Eosinophils Absolute: 0.1 10*3/uL (ref 0.0–0.5)
Eosinophils Relative: 2 %
HCT: 42.5 % (ref 36.0–46.0)
Hemoglobin: 14.6 g/dL (ref 12.0–15.0)
Immature Granulocytes: 0 %
Lymphocytes Relative: 28 %
Lymphs Abs: 1.2 10*3/uL (ref 0.7–4.0)
MCH: 30.5 pg (ref 26.0–34.0)
MCHC: 34.4 g/dL (ref 30.0–36.0)
MCV: 88.9 fL (ref 80.0–100.0)
Monocytes Absolute: 0.4 10*3/uL (ref 0.1–1.0)
Monocytes Relative: 8 %
Neutro Abs: 2.6 10*3/uL (ref 1.7–7.7)
Neutrophils Relative %: 61 %
Platelets: 135 10*3/uL — ABNORMAL LOW (ref 150–400)
RBC: 4.78 MIL/uL (ref 3.87–5.11)
RDW: 13.1 % (ref 11.5–15.5)
WBC: 4.2 10*3/uL (ref 4.0–10.5)
nRBC: 0 % (ref 0.0–0.2)

## 2020-09-15 LAB — RESPIRATORY PANEL BY RT PCR (FLU A&B, COVID)
Influenza A by PCR: NEGATIVE
Influenza B by PCR: NEGATIVE
SARS Coronavirus 2 by RT PCR: POSITIVE — AB

## 2020-09-15 NOTE — Discharge Instructions (Signed)
You have tested positive for COVID-19.  Please continue to maintain isolation precautions.  Given that you are approximately day 8 into your course of illness, I have reached out to the [facility] infusion center who will call you to discuss antibiotic therapy.  Otherwise, please continue to take DayQuil/NyQuil and other over-the-counter medications for symptomatic relief.  Check your temperature regularly and take antipyretics as needed.  Please eat regular meals and drink plenty of fluids.  You may wish to consider a pulse oximeter if you feel as though you are becoming short of breath.  It can be obtained over-the-counter at a pharmacy.  They check your oxygenation.  Return to the ED or seek immediate medical attention should you experience any new or worsening symptoms.

## 2020-09-15 NOTE — Telephone Encounter (Signed)
Referral received from [facility] emergency room for Covid positive patient on day 8 of symptoms with qualifying risk factors for monoclonal antibody infusion treatment.  Attempted to call patient to discuss qualifying factors and interested in monoclonal antibody infusion for the treatment of COVID-19.  Phone call answered by patients family- Patient is currently still located in the [facility] emergency room and does not have her phone with her.  We will try to obtain contact information to the emergency room to discuss treatment with patient prior to her discharge today.

## 2020-09-15 NOTE — Telephone Encounter (Signed)
Called to discuss with Carrie Mcdaniel about Covid symptoms and the use of casirivimab/imdevimab or bamlanivimab/etesevimab infusion for those with mild to moderate Covid symptoms and at a high risk of hospitalization.     Pt is qualified for this infusion at the monoclonal antibody infusion center due to co-morbid conditions and/or a member of an at-risk group, however declines infusion at this time. Symptoms tier reviewed as well as criteria for ending isolation.  Symptoms reviewed that would warrant ED/Hospital evaluation. Preventative practices reviewed. Patient verbalized understanding. Patient advised to call back if he decides that he does want to get infusion. Callback number to the infusion center given. Patient advised to go to Urgent care or ED with severe symptoms. Last date pt would be eligible for infusion is 10/28.     There are no problems to display for this patient.   Cline Crock PA-C

## 2020-09-15 NOTE — ED Provider Notes (Signed)
[facility]-EMERGENCY DEPT Provider Note   CSN: [id number] Arrival date & time: 09/15/20  6803     History Chief Complaint  Patient presents with  . Nasal Congestion  . Cough    Carrie Mcdaniel is a 54 y.o. female with PMH of HTN and 35-pack-year smoking history presents the ED with an 8-day history of sinus congestion, headaches, and cough productive of clear phlegm.  She states that she has also had postnasal drip, fatigue, and diminished appetite.  She works in the [employer] and reports that multiple coworkers have recently tested positive for COVID-19.  She personally is not immunized.  She has been taking DayQuil and NyQuil, with some relief of symptoms.  She is in here primarily to get tested for COVID-19, as encouraged by her employer.  She denies any shortness of breath, chest pain, extremity swelling or edema, history of clots or clotting disorder, dizziness, fevers or chills, abdominal pain, nausea vomiting, shortness of breath, or other symptoms.  HPI     Past Medical History:  Diagnosis Date  . Hypertension     There are no problems to display for this patient.   Past Surgical History:  Procedure Laterality Date  . APPENDECTOMY       OB History   No obstetric history on file.     History reviewed. No pertinent family history.  Social History   Tobacco Use  . Smoking status: Current Every Day Smoker    Packs/day: 1.00    Types: Cigarettes  . Smokeless tobacco: Never Used  Vaping Use  . Vaping Use: Never used  Substance Use Topics  . Alcohol use: Yes  . Drug use: No    Home Medications Prior to Admission medications   Medication Sig Start Date End Date Taking? Authorizing Provider  acetaminophen (TYLENOL) 500 MG tablet Take 500-1,000 mg by mouth every 6 (six) hours as needed for mild pain or headache.   Yes [provider]  Aspirin-Salicylamide-Caffeine (BC HEADACHE POWDER PO) Take 1 packet by mouth every 8  (eight) hours as needed (HA).   Yes [provider]  DM-Doxylamine-Acetaminophen (NYQUIL COLD & FLU PO) Take 1 tablet by mouth at bedtime as needed (cold symptoms).   Yes [provider]  Pseudoephedrine-APAP-DM (DAYQUIL MULTI-SYMPTOM COLD/FLU PO) Take 1 tablet by mouth daily as needed (cold symptoms).   Yes [provider]  fluticasone (FLONASE) 50 MCG/ACT nasal spray Place 2 sprays into both nostrils daily. 08/30/19 08/29/20  Fisher, Roselyn Bering, PA-C  lisinopril (ZESTRIL) 10 MG tablet Take 1 tablet (10 mg total) by mouth daily. 08/30/19 08/29/20  Fisher, Roselyn Bering, PA-C  albuterol (PROVENTIL HFA;VENTOLIN HFA) 108 (90 Base) MCG/ACT inhaler Inhale 2 puffs into the lungs every 6 (six) hours as needed for wheezing or shortness of breath. Patient not taking: Reported on 08/24/2017 06/25/17 08/30/19  Enid Derry, PA-C  hydrochlorothiazide (HYDRODIURIL) 12.5 MG tablet Take 1 tablet (12.5 mg total) by mouth daily. 07/15/18 08/30/19  Evon Slack, PA-C    Allergies    Penicillins  Review of Systems   Review of Systems  All other systems reviewed and are negative.   Physical Exam Updated Vital Signs BP 127/82   Pulse 65   Temp 98.5 F (36.9 C) (Oral)   Resp 16   SpO2 97%   Physical Exam Vitals and nursing note reviewed. Exam conducted with a chaperone present.  Constitutional:      General: She is not in acute distress.  Appearance: She is ill-appearing.  HENT:     Head: Normocephalic and atraumatic.     Nose: Congestion present.  Eyes:     General: No scleral icterus.    Conjunctiva/sclera: Conjunctivae normal.  Cardiovascular:     Rate and Rhythm: Normal rate and regular rhythm.     Pulses: Normal pulses.     Heart sounds: Normal heart sounds.  Pulmonary:     Effort: Pulmonary effort is normal. No respiratory distress.     Breath sounds: Normal breath sounds. No wheezing or rales.  Abdominal:     General: Abdomen is flat. There is no distension.      Palpations: Abdomen is soft.     Tenderness: There is no abdominal tenderness.  Musculoskeletal:     Right lower leg: No edema.     Left lower leg: No edema.  Skin:    General: Skin is dry.     Capillary Refill: Capillary refill takes less than 2 seconds.  Neurological:     Mental Status: She is alert and oriented to person, place, and time.     GCS: GCS eye subscore is 4. GCS verbal subscore is 5. GCS motor subscore is 6.  Psychiatric:        Mood and Affect: Mood normal.        Behavior: Behavior normal.        Thought Content: Thought content normal.     ED Results / Procedures / Treatments   Labs (all labs ordered are listed, but only abnormal results are displayed) Labs Reviewed  RESPIRATORY PANEL BY RT PCR (FLU A&B, COVID) - Abnormal; Notable for the following components:      Result Value   SARS Coronavirus 2 by RT PCR POSITIVE (*)    All other components within normal limits  CBC WITH DIFFERENTIAL/PLATELET - Abnormal; Notable for the following components:   Platelets 135 (*)    All other components within normal limits  COMPREHENSIVE METABOLIC PANEL - Abnormal; Notable for the following components:   Glucose, Bld 100 (*)    Calcium 8.5 (*)    AST 122 (*)    ALT 96 (*)    All other components within normal limits    EKG None  Radiology DG Chest Portable 1 View  Result Date: 09/15/2020 CLINICAL DATA:  Cough and shortness of breath EXAM: PORTABLE CHEST 1 VIEW COMPARISON:  July 2020 FINDINGS: Hyperexpansion likely reflecting emphysema. No consolidation or edema. No pleural effusion or pneumothorax. Heart size is normal. IMPRESSION: No acute process in the chest.  Suspected emphysema. Electronically Signed   By: Guadlupe Spanish M.D.   On: 09/15/2020 08:45    Procedures Procedures (including critical care time)  Medications Ordered in ED Medications - No data to display  ED Course  I have reviewed the triage vital signs and the nursing notes.  Pertinent labs &  imaging results that were available during my care of the patient were reviewed by me and considered in my medical decision making (see chart for details).    MDM Rules/Calculators/A&P                          Patient with symptoms consistent with URI for 8 days.  Patient's CXR is negative for acute infiltrate.  Symptoms are likely of viral etiology.  Discussed that antibiotics are not indicated for viral infections.  Patient will be discharged with symptomatic treatment.  Patient is tolerating food and liquid  without difficulty and I do not believe that laboratory work-up would yield any significant findings.  I emphasized the importance of rest, continued oral hydration, and antipyretics as needed for fever control.    They were provided opportunity to ask any additional questions and have none at this time.  Prior to discharge patient is feeling well, agreeable with plan for discharge home.  They have expressed understanding of verbal discharge instructions as well as return precautions and are agreeable to the plan.   Chest x-ray does reveal emphysema, consistent with her 35-pack-year smoking history.  She does not have any medical insurance and does not have a primary care provider.  We will refer her to [facility].  CMP with transaminitis that is new.  That, in conjunction with borderline leukopenia and her recent COVID-19 contacts at place of employment, suspect that it is related to her viral illness.  Respiratory panel by PCR is positive for COVID-19.  Will contact infusion center.  Otherwise, encouraging continue DayQuil/NyQuil and other over-the-counter therapies for symptomatic relief.  Encouraged that she check her temperature regularly and take antibiotics as needed.  All of the evaluation and work-up results were discussed with the patient and any family at bedside.  Patient and/or family were informed that while patient is appropriate for  discharge at this time, some medical emergencies may only develop or become detectable after a period of time.  I specifically instructed patient and/or family to return to return to the ED or seek immediate medical attention for any new or worsening symptoms.  They were provided opportunity to ask any additional questions and have none at this time.  Prior to discharge patient is feeling well, agreeable with plan for discharge home.  They have expressed understanding of verbal discharge instructions as well as return precautions and are agreeable to the plan.   TALEIGH GERO was evaluated in Emergency Department on 09/15/2020 for the symptoms described in the history of present illness. She was evaluated in the context of the global COVID-19 pandemic, which necessitated consideration that the patient might be at risk for infection with the SARS-CoV-2 virus that causes COVID-19. Institutional protocols and algorithms that pertain to the evaluation of patients at risk for COVID-19 are in a state of rapid change based on information released by regulatory bodies including the CDC and federal and state organizations. These policies and algorithms were followed during the patient's care in the ED.  The patient was counseled on the dangers of tobacco use, and was advised to quit.  Reviewed strategies to maximize success, including removing cigarettes and smoking materials from environment, stress management, substitution of other forms of reinforcement, support of family/friends and written materials. Total time was 5 min CPT code 11941.    Final Clinical Impression(s) / ED Diagnoses Final diagnoses:  COVID-19    Rx / DC Orders ED Discharge Orders    None       Elvera Maria 09/15/20 1207    Benjiman Core, MD 09/15/20 1357

## 2020-09-15 NOTE — ED Triage Notes (Signed)
Pt c/o sinus and chest congestion with clear productive cough. Pt admits to pack a day smoker. Hx of bronchitis. Denies fever, N/V/D, or SOB. States some co-workers tested positive for [employer] last week.

## 2020-10-29 ENCOUNTER — Other Ambulatory Visit (HOSPITAL_COMMUNITY): Payer: Self-pay | Admitting: Emergency Medicine

## 2020-10-29 ENCOUNTER — Emergency Department (HOSPITAL_COMMUNITY)
Admission: EM | Admit: 2020-10-29 | Discharge: 2020-10-29 | Disposition: A | Discharge status: Home / Self Care | Payer: Self-pay | Attending: Emergency Medicine | Admitting: Emergency Medicine

## 2020-10-29 ENCOUNTER — Other Ambulatory Visit: Payer: Self-pay

## 2020-10-29 ENCOUNTER — Encounter (HOSPITAL_COMMUNITY): Payer: Self-pay

## 2020-10-29 DIAGNOSIS — I10 Essential (primary) hypertension: Secondary | ICD-10-CM | POA: Insufficient documentation

## 2020-10-29 DIAGNOSIS — Z7982 Long term (current) use of aspirin: Secondary | ICD-10-CM | POA: Insufficient documentation

## 2020-10-29 DIAGNOSIS — F17210 Nicotine dependence, cigarettes, uncomplicated: Secondary | ICD-10-CM | POA: Insufficient documentation

## 2020-10-29 DIAGNOSIS — J45909 Unspecified asthma, uncomplicated: Secondary | ICD-10-CM | POA: Insufficient documentation

## 2020-10-29 DIAGNOSIS — R197 Diarrhea, unspecified: Secondary | ICD-10-CM | POA: Insufficient documentation

## 2020-10-29 DIAGNOSIS — J449 Chronic obstructive pulmonary disease, unspecified: Secondary | ICD-10-CM | POA: Insufficient documentation

## 2020-10-29 DIAGNOSIS — Z8616 Personal history of COVID-19: Secondary | ICD-10-CM | POA: Insufficient documentation

## 2020-10-29 DIAGNOSIS — R519 Headache, unspecified: Secondary | ICD-10-CM | POA: Insufficient documentation

## 2020-10-29 DIAGNOSIS — M791 Myalgia, unspecified site: Secondary | ICD-10-CM | POA: Insufficient documentation

## 2020-10-29 DIAGNOSIS — Z79899 Other long term (current) drug therapy: Secondary | ICD-10-CM | POA: Insufficient documentation

## 2020-10-29 LAB — CBC
HCT: 44 % (ref 36.0–46.0)
Hemoglobin: 14.7 g/dL (ref 12.0–15.0)
MCH: 31.1 pg (ref 26.0–34.0)
MCHC: 33.4 g/dL (ref 30.0–36.0)
MCV: 93.2 fL (ref 80.0–100.0)
Platelets: 210 10*3/uL (ref 150–400)
RBC: 4.72 MIL/uL (ref 3.87–5.11)
RDW: 13.3 % (ref 11.5–15.5)
WBC: 8.9 10*3/uL (ref 4.0–10.5)
nRBC: 0 % (ref 0.0–0.2)

## 2020-10-29 LAB — URINALYSIS, ROUTINE W REFLEX MICROSCOPIC
Bilirubin Urine: NEGATIVE
Glucose, UA: NEGATIVE mg/dL
Hgb urine dipstick: NEGATIVE
Ketones, ur: NEGATIVE mg/dL
Leukocytes,Ua: NEGATIVE
Nitrite: NEGATIVE
Protein, ur: NEGATIVE mg/dL
Specific Gravity, Urine: 1.003 — ABNORMAL LOW (ref 1.005–1.030)
pH: 5 (ref 5.0–8.0)

## 2020-10-29 LAB — COMPREHENSIVE METABOLIC PANEL
ALT: 38 U/L (ref 0–44)
AST: 34 U/L (ref 15–41)
Albumin: 4.5 g/dL (ref 3.5–5.0)
Alkaline Phosphatase: 98 U/L (ref 38–126)
Anion gap: 9 (ref 5–15)
BUN: 15 mg/dL (ref 6–20)
CO2: 23 mmol/L (ref 22–32)
Calcium: 9.3 mg/dL (ref 8.9–10.3)
Chloride: 104 mmol/L (ref 98–111)
Creatinine, Ser: 0.53 mg/dL (ref 0.44–1.00)
GFR, Estimated: >60 mL/min (ref >60)
Glucose, Bld: 112 mg/dL — ABNORMAL HIGH (ref 70–99)
Potassium: 4.1 mmol/L (ref 3.5–5.1)
Sodium: 136 mmol/L (ref 135–145)
Total Bilirubin: 0.4 mg/dL (ref 0.3–1.2)
Total Protein: 7.4 g/dL (ref 6.5–8.1)

## 2020-10-29 LAB — LIPASE, BLOOD: Lipase: 41 U/L (ref 11–51)

## 2020-10-29 MED ORDER — LISINOPRIL 10 MG PO TABS
10.0000 mg | ORAL_TABLET | Freq: Every day | ORAL | Status: DC
  Administered 2020-10-29: 10 mg via ORAL
  Filled 2020-10-29: qty 1

## 2020-10-29 MED ORDER — LISINOPRIL 10 MG PO TABS: 10.0000 mg | ORAL_TABLET | Freq: Every day | ORAL | 0 refills | Status: AC

## 2020-10-29 MED FILL — LISINOPRIL 10 MG TABS: 10 | 30 days supply | Qty: 30 | Fill #0

## 2020-10-29 NOTE — ED Provider Notes (Signed)
[facility]-EMERGENCY DEPT Provider Note   CSN: [id number] Arrival date & time: 10/29/20  0710     History Chief Complaint  Patient presents with  . Abdominal Pain  . Diarrhea  . Headache    Carrie Mcdaniel is a 54 y.o. female w/ PMHx of HTN presenting for fever, body aches and upset stomach 1 week prior. Tmax 99.5.  Initially symptoms started with body aches and chills. Associated symptoms of diarrhea, and headache. Hx of COVID + 10/22. Not vaccinated, has fear surrounding the vaccine. Would like resources about the vaccine. No current concerns. Feels well today. Not taking her antihypertensive medication for 1.5 months because it causes polyuria. Does not have a PCP. Is a known smoke and aware that she was diagnosed with asthma/COPD, prescribed an inhaler, but does not use/have it due to financial burden. Known smoker 37 pk yr history.   Past Medical History:  Diagnosis Date  . Hypertension    There are no problems to display for this patient.  Past Surgical History:  Procedure Laterality Date  . APPENDECTOMY      OB History   No obstetric history on file.    Family History  Problem Relation Age of Onset  . Hypertension Mother   . Hypertension Father    Social History   Tobacco Use  . Smoking status: Current Every Day Smoker    Packs/day: 1.00    Types: Cigarettes  . Smokeless tobacco: Never Used  Vaping Use  . Vaping Use: Never used  Substance Use Topics  . Alcohol use: Yes  . Drug use: No   Home Medications Prior to Admission medications   Medication Sig Start Date End Date Taking? Authorizing Provider  acetaminophen (TYLENOL) 500 MG tablet Take 500-1,000 mg by mouth every 6 (six) hours as needed for mild pain or headache.    [provider]  Aspirin-Salicylamide-Caffeine (BC HEADACHE POWDER PO) Take 1 packet by mouth every 8 (eight) hours as needed (HA).    [provider]  DM-Doxylamine-Acetaminophen (NYQUIL COLD &  FLU PO) Take 1 tablet by mouth at bedtime as needed (cold symptoms).    [provider]  fluticasone (FLONASE) 50 MCG/ACT nasal spray Place 2 sprays into both nostrils daily. 08/30/19 08/29/20  Fisher, Roselyn Bering, PA-C  lisinopril (ZESTRIL) 10 MG tablet Take 1 tablet (10 mg total) by mouth daily. 08/30/19 08/29/20  Fisher, Roselyn Bering, PA-C  Pseudoephedrine-APAP-DM (DAYQUIL MULTI-SYMPTOM COLD/FLU PO) Take 1 tablet by mouth daily as needed (cold symptoms).    [provider]  albuterol (PROVENTIL HFA;VENTOLIN HFA) 108 (90 Base) MCG/ACT inhaler Inhale 2 puffs into the lungs every 6 (six) hours as needed for wheezing or shortness of breath. Patient not taking: Reported on 08/24/2017 06/25/17 08/30/19  Enid Derry, PA-C  hydrochlorothiazide (HYDRODIURIL) 12.5 MG tablet Take 1 tablet (12.5 mg total) by mouth daily. 07/15/18 08/30/19  Evon Slack, PA-C   Allergies    Penicillins  Review of Systems   Review of Systems  Constitutional: Negative for activity change, appetite change, chills and fever.  HENT: Negative for congestion.   Eyes: Negative for visual disturbance.  Respiratory: Negative for cough, chest tightness and shortness of breath.   Cardiovascular: Negative for chest pain.  Gastrointestinal: Negative for abdominal pain, nausea and vomiting.  Genitourinary: Positive for frequency. Negative for difficulty urinating.  Neurological: Negative for light-headedness and headaches.   Physical Exam Updated Vital Signs BP (!) 181/107 (BP Location: Right Arm)   Pulse 69  Temp 97.7 F (36.5 C) (Oral)   Resp 16   Ht 5\' 8"  (1.727 m)   Wt 62.1 kg   SpO2 100%   BMI 20.83 kg/m   Physical Exam Constitutional:      General: She is not in acute distress.    Appearance: She is well-developed. She is not ill-appearing or toxic-appearing.  HENT:     Head: Normocephalic and atraumatic.     Mouth/Throat:     Mouth: Mucous membranes are moist.     Pharynx: Oropharynx is clear. No  pharyngeal swelling or oropharyngeal exudate.  Eyes:     General: No scleral icterus.    Extraocular Movements: Extraocular movements intact.     Pupils: Pupils are equal, round, and reactive to light.  Cardiovascular:     Rate and Rhythm: Normal rate and regular rhythm.     Heart sounds: Normal heart sounds.  Pulmonary:     Effort: Pulmonary effort is normal. No respiratory distress.     Breath sounds: Normal breath sounds. No wheezing or rales.  Abdominal:     General: Abdomen is flat. Bowel sounds are normal. There is no distension.     Palpations: There is no hepatomegaly or mass.     Tenderness: There is no abdominal tenderness. Negative signs include McBurney's sign and psoas sign.     Hernia: No hernia is present.  Skin:    General: Skin is warm and dry.     Findings: No rash.  Neurological:     Mental Status: She is alert and oriented to person, place, and time.     Motor: No weakness.  Psychiatric:        Mood and Affect: Mood normal.        Behavior: Behavior normal.    ED Results / Procedures / Treatments   Labs (all labs ordered are listed, but only abnormal results are displayed) Labs Reviewed  COMPREHENSIVE METABOLIC PANEL - Abnormal; Notable for the following components:      Result Value   Glucose, Bld 112 (*)    All other components within normal limits  URINALYSIS, ROUTINE W REFLEX MICROSCOPIC - Abnormal; Notable for the following components:   Color, Urine COLORLESS (*)    Specific Gravity, Urine 1.003 (*)    All other components within normal limits  LIPASE, BLOOD  CBC   Medications Ordered in ED Medications  lisinopril (ZESTRIL) tablet 10 mg (10 mg Oral Given 10/29/20 0915)   ED Course  I have reviewed the triage vital signs and the nursing notes.  Pertinent labs & imaging results that were available during my care of the patient were reviewed by me and considered in my medical decision making (see chart for details).  Reviewed UA. Unremarkable  for UTI.  Reviewed CBC, CMP, lipase wnl.    MDM Rules/Calculators/A&P                         Carrie Mcdaniel is a 54 y.o. female w/ PMHx of HTN presenting for fever, body aches and upset stomach 1 week prior.  Patient is well appearing without current acute concerns. BP elevated on admission but patient is asymptomatic. Chronically hypertensive due to not taking medication for almost 2 months. Will give home does of lisinopril x1. Needs PCP and medication assistance. Will consult SW.   BP improved to 130/85 s/p lisinopril 10 mg x1. [facility]. Patient to follow up with PCP. Stable  for discharge home.   Final Clinical Impression(s) / ED Diagnoses Final diagnoses:  Hypertension, unspecified type   Rx / DC Orders ED Discharge Orders         Ordered    lisinopril (ZESTRIL) 10 MG tablet  Daily        10/29/20 1016           Autry-Lott, [place], DO 10/29/20 1059    Jacalyn Lefevre, MD 10/29/20 1330

## 2020-10-29 NOTE — Progress Notes (Signed)
   10/29/20 1033  TOC ED Mini Assessment  TOC Time spent with patient (minutes): 20  PING Used in TOC Assessment Yes  Admission or Readmission Diverted Yes  Interventions which prevented an admission or readmission Medication Review;PCP Appointment Scheduled  What brought you to the Emergency Department?  stomach pains  Barriers to Discharge ED PCP establishment;ED Uninsured needing PCP establishment;ED Uninsured needing medication assistance  Means of departure Car   Tarnisha Kachmar J. Lucretia Roers, RN, BSN, [place] [phone]  RNCM set up appointment with [facility] on 11/25/20 @ 0830.  Placed appointment information on After Visit Summary (AVS) and advised to please arrive 15 min early and take a picture ID and your current medications.    RNCM consulted by EDP [place] for medication assistance. RNCM reviewed chart and spoke with the pt about [facility] MATCH program ($3 co pay for each Rx through [facility] program, does not include refills, 7 day expiration of MATCH letter and choice of pharmacies). Pt is eligible for [facility] MATCH program (unable to find pt listed in PROCARE per cardholder name inquiry) and has agreed to accept MATCH under terms discussed. PROCARE information entered. MATCH letter completed and placed in chart for EDRN to print and give to pt.   RNCM updated EDP and ED RN.

## 2020-10-29 NOTE — Discharge Planning (Signed)
  MATCH Medication Assistance Card Name: Carrie Mcdaniel (MRN): [id number] Bin: [id number] RX Group: [id number] Discharge Date: 10/29/2020 Expiration Date:11/06/2020                                           (must be filled within 7 days of discharge)     You have been approved to have the prescriptions written by your discharging physician filled through our [facility] (Medication Assistance Through [facility] program. This program allows for a one-time (no refills) 34-day supply of selected medications for a low copay amount.  The copay is $3.00 per prescription. For instance, if you have one prescription, you will pay $3.00; for two prescriptions, you pay $6.00; for three prescriptions, you pay $9.00; and so on.  Only certain pharmacies are participating in this program with [facility]. You will need to select one of the pharmacies from the attached list and take your prescriptions, this letter, and your photo ID to one of the [facility] pharmacies, [facility], [facility] [address], or [facility] [address].   We are excited that you are able to use the [facility] program to get your medications. These prescriptions must be filled within 7 days of hospital discharge or they will no longer be valid for the [facility] program. Should you have any problems with your prescriptions please contact your case management team member at [phone] for [place]/ [facility].  Thank you, [facility] Health Care Management

## 2020-10-29 NOTE — ED Triage Notes (Addendum)
Patient c/o abdominal pain, diarrhea, and headache x 6 days.  Patient states she had Covid-19 on 09/11/20  BP-181/107 in triage. Patient states she has not been taking her Lisinopril x 1 1/2 months because it makes her urinate a lot.

## 2020-10-29 NOTE — Discharge Instructions (Signed)
Continue to take your lisinopril daily. Go to PCP appointment as indicated.

## 2020-11-24 NOTE — Progress Notes (Deleted)
Patient ID: Carrie Mcdaniel, female   DOB: 1966-08-26, 55 y.o.   MRN: [id number]   After being seen in the ED 10/29/2020 with several complaints.  From ED note:  HPI Carrie Mcdaniel is a 55 y.o. female w/ PMHx of HTN presenting for fever, body aches and upset stomach 1 week prior. Tmax 99.5.  Initially symptoms started with body aches and chills. Associated symptoms of diarrhea, and headache. Hx of COVID + 10/22. Not vaccinated, has fear surrounding the vaccine. Would like resources about the vaccine. No current concerns. Feels well today. Not taking her antihypertensive medication for 1.5 months because it causes polyuria. Does not have a PCP. Is a known smoke and aware that she was diagnosed with asthma/COPD, prescribed an inhaler, but does not use/have it due to financial burden. Known smoker 37 pk yr history.  A/P: Carrie Mcdaniel is a 55 y.o. female w/ PMHx of HTN presenting for fever, body aches and upset stomach 1 week prior.  Patient is well appearing without current acute concerns. BP elevated on admission but patient is asymptomatic. Chronically hypertensive due to not taking medication for almost 2 months. Will give home does of lisinopril x1. Needs PCP and medication assistance. Will consult SW.   BP improved to 130/85 s/p lisinopril 10 mg x1. [facility]. Patient to follow up with PCP. Stable for discharge home.

## 2020-11-25 ENCOUNTER — Other Ambulatory Visit: Payer: Self-pay

## 2020-11-25 ENCOUNTER — Ambulatory Visit: Payer: Self-pay | Attending: Physician Assistant | Admitting: Physician Assistant

## 2021-07-16 ENCOUNTER — Encounter: Payer: Self-pay | Admitting: Emergency Medicine

## 2021-07-16 ENCOUNTER — Emergency Department
Admission: EM | Admit: 2021-07-16 | Discharge: 2021-07-16 | Disposition: A | Discharge status: Home / Self Care | Payer: Self-pay | Attending: Emergency Medicine | Admitting: Emergency Medicine

## 2021-07-16 ENCOUNTER — Other Ambulatory Visit: Payer: Self-pay

## 2021-07-16 DIAGNOSIS — Z79899 Other long term (current) drug therapy: Secondary | ICD-10-CM | POA: Insufficient documentation

## 2021-07-16 DIAGNOSIS — S90862A Insect bite (nonvenomous), left foot, initial encounter: Secondary | ICD-10-CM | POA: Insufficient documentation

## 2021-07-16 DIAGNOSIS — F17210 Nicotine dependence, cigarettes, uncomplicated: Secondary | ICD-10-CM | POA: Insufficient documentation

## 2021-07-16 DIAGNOSIS — Y9301 Activity, walking, marching and hiking: Secondary | ICD-10-CM | POA: Insufficient documentation

## 2021-07-16 DIAGNOSIS — I10 Essential (primary) hypertension: Secondary | ICD-10-CM | POA: Insufficient documentation

## 2021-07-16 DIAGNOSIS — W57XXXA Bitten or stung by nonvenomous insect and other nonvenomous arthropods, initial encounter: Secondary | ICD-10-CM | POA: Insufficient documentation

## 2021-07-16 DIAGNOSIS — L03116 Cellulitis of left lower limb: Secondary | ICD-10-CM | POA: Insufficient documentation

## 2021-07-16 DIAGNOSIS — Y92017 Garden or yard in single-family (private) house as the place of occurrence of the external cause: Secondary | ICD-10-CM | POA: Insufficient documentation

## 2021-07-16 MED ORDER — CEPHALEXIN 500 MG PO CAPS: 500.0000 mg | ORAL_CAPSULE | Freq: Three times a day (TID) | ORAL | 0 refills | Status: AC

## 2021-07-16 NOTE — ED Provider Notes (Signed)
[facility] Emergency Department Provider Note ____________________________________________   Event Date/Time   First MD Initiated Contact with Patient 07/16/21 1047     (approximate)  I have reviewed the triage vital signs and the nursing notes.  HISTORY  Chief Complaint Insect Bite   HPI Carrie Mcdaniel is a 55 y.o. femalewho presents to the ED for evaluation of insect bite and foot redness.   Chart review indicates hx Htn.   Patient presents to the ED for evaluation of spreading red rash to her left dorsal foot after an insect bite a few days prior.  She reports a insect bite about 1 week ago while walking through her garden with sandals.  Denies any ticks or launched insects.  She reports developing a red rash a few days later, and has been spreading with a past 2-3 days.  Denies fevers, systemic symptoms or significant pain.  She reports explicit concern for cellulitis.  Past Medical History:  Diagnosis Date   Hypertension     There are no problems to display for this patient.   Past Surgical History:  Procedure Laterality Date   APPENDECTOMY      Prior to Admission medications   Medication Sig Start Date End Date Taking? Authorizing Provider  cephALEXin (KEFLEX) 500 MG capsule Take 1 capsule (500 mg total) by mouth 3 (three) times daily for 5 days. 07/16/21 07/21/21 Yes Delton Prairie, MD  DM-Doxylamine-Acetaminophen (NYQUIL COLD & FLU PO) Take 1 tablet by mouth at bedtime as needed (cold symptoms).    [provider]  ibuprofen (ADVIL) 200 MG tablet Take 600 mg by mouth every 6 (six) hours as needed for fever, headache or mild pain.    [provider]  lisinopril (ZESTRIL) 10 MG tablet Take 1 tablet (10 mg total) by mouth daily. 10/29/20 11/28/20  Jacalyn Lefevre, MD  lisinopril (ZESTRIL) 10 MG tablet TAKE 1 TABLET BY MOUTH ONCE DAILY 10/29/20 10/29/21  Jacalyn Lefevre, MD  Multiple Vitamins-Minerals (ZINC PO) Take 1 tablet by  mouth every other day.    [provider]  Pseudoephedrine-APAP-DM (DAYQUIL MULTI-SYMPTOM COLD/FLU PO) Take 1 tablet by mouth daily as needed (cold symptoms).    [provider]  albuterol (PROVENTIL HFA;VENTOLIN HFA) 108 (90 Base) MCG/ACT inhaler Inhale 2 puffs into the lungs every 6 (six) hours as needed for wheezing or shortness of breath. Patient not taking: Reported on 08/24/2017 06/25/17 08/30/19  Enid Derry, PA-C  hydrochlorothiazide (HYDRODIURIL) 12.5 MG tablet Take 1 tablet (12.5 mg total) by mouth daily. 07/15/18 08/30/19  Evon Slack, PA-C    Allergies Penicillins  Family History  Problem Relation Age of Onset   Hypertension Mother    Hypertension Father     Social History Social History   Tobacco Use   Smoking status: Every Day    Packs/day: 1.00    Types: Cigarettes   Smokeless tobacco: Never  Vaping Use   Vaping Use: Never used  Substance Use Topics   Alcohol use: Yes   Drug use: No    Review of Systems  Constitutional: No fever/chills Eyes: No visual changes. ENT: No sore throat. Cardiovascular: Denies chest pain. Respiratory: Denies shortness of breath. Gastrointestinal: No abdominal pain.  No nausea, no vomiting.  No diarrhea.  No constipation. Genitourinary: Negative for dysuria. Musculoskeletal: Negative for back pain. Skin: Spreading red rash to left dorsal foot after insect bite Neurological: Negative for headaches, focal weakness or numbness.   ____________________________________________   PHYSICAL EXAM:  VITAL  SIGNS: Vitals:   07/16/21 1015  BP: (!) 147/103  Pulse: 82  Resp: 17  Temp: 98.6 F (37 C)  SpO2: 99%    Constitutional: Alert and oriented. Well appearing and in no acute distress. Eyes: Conjunctivae are normal. PERRL. EOMI. Head: Atraumatic. Nose: No congestion/rhinnorhea. Mouth/Throat: Mucous membranes are moist.  Oropharynx non-erythematous. Neck: No stridor. No cervical spine tenderness to  palpation. Cardiovascular: Normal rate, regular rhythm. Grossly normal heart sounds.  Good peripheral circulation. Respiratory: Normal respiratory effort.  No retractions. Lungs CTAB. Gastrointestinal: Soft , nondistended, nontender to palpation. No CVA tenderness. Musculoskeletal: No lower extremity tenderness nor edema.  No joint effusions. No signs of acute trauma. Neurologic:  Normal speech and language. No gross focal neurologic deficits are appreciated. No gait instability noted. Skin:  Skin is warm, dry and intact.  Flat erythematous rash to the dorsum of the left foot, about 5 cm in diameter, circular.  No target lesion or central clearing.  Strong DP pulse and foot is neurovascularly intact  Psychiatric: Mood and affect are normal. Speech and behavior are normal. ____________________________________________   LABS (all labs ordered are listed, but only abnormal results are displayed)  Labs Reviewed - No data to display ____________________________________________  12 Lead EKG   ____________________________________________  RADIOLOGY  ED MD interpretation:    Official radiology report(s): No results found.  ____________________________________________   PROCEDURES and INTERVENTIONS  Procedure(s) performed (including Critical Care):  Procedures  Medications - No data to display  ____________________________________________   MDM / ED COURSE   Healthy 55 year old woman presents to the ED with evidence of cellulitis after insect bite to her foot.  No signs of systemic illness, sepsis or significant trauma.  Indications for imaging and blood work.  We will discharge with empiric Keflex and return precautions.      ____________________________________________   FINAL CLINICAL IMPRESSION(S) / ED DIAGNOSES  Final diagnoses:  Cellulitis of left lower extremity  Insect bite of left foot, initial encounter     ED Discharge Orders          Ordered     cephALEXin (KEFLEX) 500 MG capsule  3 times daily        07/16/21 1059             Rella Egelston   Note:  This document was prepared using Conservation officer, historic buildings and may include unintentional dictation errors.    Delton Prairie, MD 07/16/21 [phone]

## 2021-07-16 NOTE — ED Triage Notes (Signed)
Pt comes into the ED via POV c/o insect bite/sting to the left foot.  Pt does present with redness /cellulitis around the area affected.  Pt ambulatory to triage at this time.  Pt states this happened about a week ago.

## 2021-07-16 NOTE — Discharge Instructions (Addendum)
Use Tylenol for pain and fevers.  Up to 1000 mg per dose, up to 4 times per day.  Do not take more than 4000 mg of Tylenol/acetaminophen within 24 hours..  Use naproxen/Aleve for anti-inflammatory pain relief. Use up to 500mg every 12 hours. Do not take more frequently than this. Do not use other NSAIDs (ibuprofen, Advil) while taking this medication. It is safe to take Tylenol with this.   

## 2022-03-01 ENCOUNTER — Other Ambulatory Visit (HOSPITAL_COMMUNITY): Payer: Self-pay

## 2022-03-01 ENCOUNTER — Emergency Department: Payer: Self-pay

## 2022-03-01 ENCOUNTER — Other Ambulatory Visit: Payer: Self-pay

## 2022-03-01 ENCOUNTER — Emergency Department
Admission: EM | Admit: 2022-03-01 | Discharge: 2022-03-01 | Disposition: A | Discharge status: Home / Self Care | Payer: Self-pay | Attending: Emergency Medicine | Admitting: Emergency Medicine

## 2022-03-01 DIAGNOSIS — W010XXA Fall on same level from slipping, tripping and stumbling without subsequent striking against object, initial encounter: Secondary | ICD-10-CM | POA: Insufficient documentation

## 2022-03-01 DIAGNOSIS — I10 Essential (primary) hypertension: Secondary | ICD-10-CM | POA: Insufficient documentation

## 2022-03-01 DIAGNOSIS — M79641 Pain in right hand: Secondary | ICD-10-CM | POA: Insufficient documentation

## 2022-03-01 DIAGNOSIS — Y92009 Unspecified place in unspecified non-institutional (private) residence as the place of occurrence of the external cause: Secondary | ICD-10-CM | POA: Insufficient documentation

## 2022-03-01 DIAGNOSIS — S6291XA Unspecified fracture of right wrist and hand, initial encounter for closed fracture: Secondary | ICD-10-CM | POA: Insufficient documentation

## 2022-03-01 DIAGNOSIS — S62101A Fracture of unspecified carpal bone, right wrist, initial encounter for closed fracture: Secondary | ICD-10-CM

## 2022-03-01 MED ORDER — OXYCODONE HCL 5 MG PO TABS
5.0000 mg | ORAL_TABLET | Freq: Once | ORAL | Status: AC
  Administered 2022-03-01: 5 mg via ORAL
  Filled 2022-03-01: qty 1

## 2022-03-01 MED ORDER — HYDROCODONE-ACETAMINOPHEN 5-325 MG PO TABS
1.0000 | ORAL_TABLET | Freq: Four times a day (QID) | ORAL | 0 refills | Status: AC | PRN
  Filled 2022-03-01: qty 16, 4d supply, fill #0

## 2022-03-01 MED ORDER — NAPROXEN 500 MG PO TABS
500.0000 mg | ORAL_TABLET | Freq: Two times a day (BID) | ORAL | 0 refills | Status: DC
  Filled 2022-03-01: qty 30, 15d supply, fill #0

## 2022-03-01 NOTE — ED Triage Notes (Signed)
Pt comes pov with right hand pain after mechanical fall onto her wrist/hand yesterday. Hand is swollen/deformed.  ?

## 2022-03-01 NOTE — ED Notes (Signed)
See triage note  presents with pain to right hand/wrist  states she tripped over a rug last pm  pain and swelling noted to wrist and hand  positive pulses ?

## 2022-03-01 NOTE — ED Provider Notes (Addendum)
[facility] ?Emergency Department Provider Note ?____________________________________________ ? ?Time seen: Approximately 9:13 AM ? ?I have reviewed the triage vital signs and the nursing notes. ? ? ?HISTORY ? ?Chief Complaint ?Hand Pain ? ? ? ?HPI ?ABBEGALE STEHLE is a 56 y.o. female with history of hypertension, cellulitis, and as listed in EMR who presents to the emergency department for evaluation and treatment of right hand and wrist pain after slipping on a rug and falling onto concrete floor at home last night. Upon awakening this morning, her had is swollen and very painful. No alleviating measures prior to arrival.  ? ?Past Medical History:  ?Diagnosis Date  ? Hypertension   ? ? ? ?PHYSICAL EXAM: ? ?VITAL SIGNS: ?ED Triage Vitals  ?Enc Vitals Group  ?   BP --166/77   ?   Pulse --77   ?   Resp --20   ?   Temp --98   ?   Temp src --   ?   SpO2 --98   ?   Weight 03/01/22 0854 136 lb 11 oz (62 kg)  ?   Height 03/01/22 0912 5\' 8"  (1.727 m)  ?   Head Circumference --   ?   Peak Flow --   ?   Pain Score 03/01/22 0854 10  ?   Pain Loc --   ?   Pain Edu? --   ?   Excl. in GC? --   ? ? ?Constitutional: Alert and oriented.  ?Neck: Supple ?Respiratory: No cough. Respirations are even and unlabored. ?Musculoskeletal: Diffuse swelling of right hand ?Neurologic: Motor and sensory function intact.  ?Skin: No open wounds over the right hand or wrist.  ?Psychiatric: Affect and behavior are appropriate. ? ?____________________________________________ ?  ?LABS ?(all labs ordered are listed, but only abnormal results are displayed) ? ?Labs Reviewed - No data to display ?____________________________________________ ? ?RADIOLOGY ? ?CT of the hand and wrist shows a small acute intra-articular fractures of the capitate at the third [facility], hamate at the fourth [facility], and ulnar base of the second metacarpal at the second [facility] with no dislocation. Mild widening at the scapholunate interval. ? ? ?I, [facility],  personally viewed and evaluated these images (plain radiographs) as part of my medical decision making, as well as reviewing the written report by the radiologist. ? ?____________________________________________ ? ? ?PROCEDURES ? ?.Ortho Injury Treatment ? ?Date/Time: 03/01/2022 3:41 PM ?Performed by: 05/01/2022, FNP ?Authorized by: Chinita Pester, FNP  ? ?Consent:  ?  Consent obtained:  Verbal ?  Consent given by:  PatientInjury location: wrist ?Location details: right wrist ?Injury type: fracture ?Pre-procedure neurovascular assessment: neurovascularly intact ?Manipulation performed: no ?Immobilization: splint and sling ?Splint type: volar short arm ?Splint Applied by: ED Nurse and ED Provider ?Supplies used: cotton padding, elastic bandage and Ortho-Glass ?Post-procedure neurovascular assessment: post-procedure neurovascularly intact ?Post-procedure range of motion: unchanged ? ? ? ?____________________________________________ ? ? ?INITIAL IMPRESSION / ASSESSMENT AND PLAN / ED COURSE ? ?DEANNAH ROSSI is a 56 y.o. who presents to the emergency department for evaluation after mechanical, nonsyncopal fall last night at home.  See HPI for further details. ? ?X-ray of the hand is nondiagnostic for bony abnormality.  The image of the wrist does show some concern for ligamentous injury.  Exam of the hand is not consistent with the x-ray results.  Will order CT for further evaluation. ? ?CT confirms several fractures at the [facility] joints.  Case discussed with Dr. [facility] who is  on-call for orthopedics.  Will place her in a volar splint in functional position and have her follow-up in the office with Dr. Stephenie Acres, the hand specialist. ? ?Patient instructed to follow-up with orthopedics and is to call for an appointment.  She was also instructed to return to the emergency department for symptoms that change or worsen if unable schedule an appointment with orthopedics or primary care. ? ?Medications  ?oxyCODONE (Oxy  IR/ROXICODONE) immediate release tablet 5 mg (5 mg Oral Given 03/01/22 0924)  ? ? ?Pertinent labs & imaging results that were available during my care of the patient were reviewed by me and considered in my medical decision making (see chart for details). ?  ?_________________________________________ ? ? ?FINAL CLINICAL IMPRESSION(S) / ED DIAGNOSES ? ?Final diagnoses:  ?Closed fracture of right wrist, initial encounter  ? ? ? ?ED Discharge Orders   ? ?      Ordered  ?  HYDROcodone-acetaminophen (NORCO/VICODIN) 5-325 MG tablet  Every 6 hours PRN       ? 03/01/22 1154  ?  naproxen (NAPROSYN) 500 MG tablet  2 times daily with meals       ? 03/01/22 1154  ? ?  ?  ? ?  ? ? ? ?If controlled substance prescribed during this visit, 12 month history viewed on the [place] prior to issuing an initial prescription for Schedule II or III opiod. ? ?  ?Chinita Pester, FNP ?03/01/22 1541 ? ?  ?Chinita Pester, FNP ?03/01/22 1543 ? ?  ?Concha Se, MD ?03/02/22 1908 ? ?

## 2022-11-19 IMAGING — CR DG HAND COMPLETE 3+V*R*
3 series · 3 of 3 positions shown · non-contrast
Comparison: None.

CLINICAL DATA: Fell yesterday with pain and swelling of the hand
and wrist

EXAM:
RIGHT HAND - COMPLETE 3+ VIEW

[hand ap]
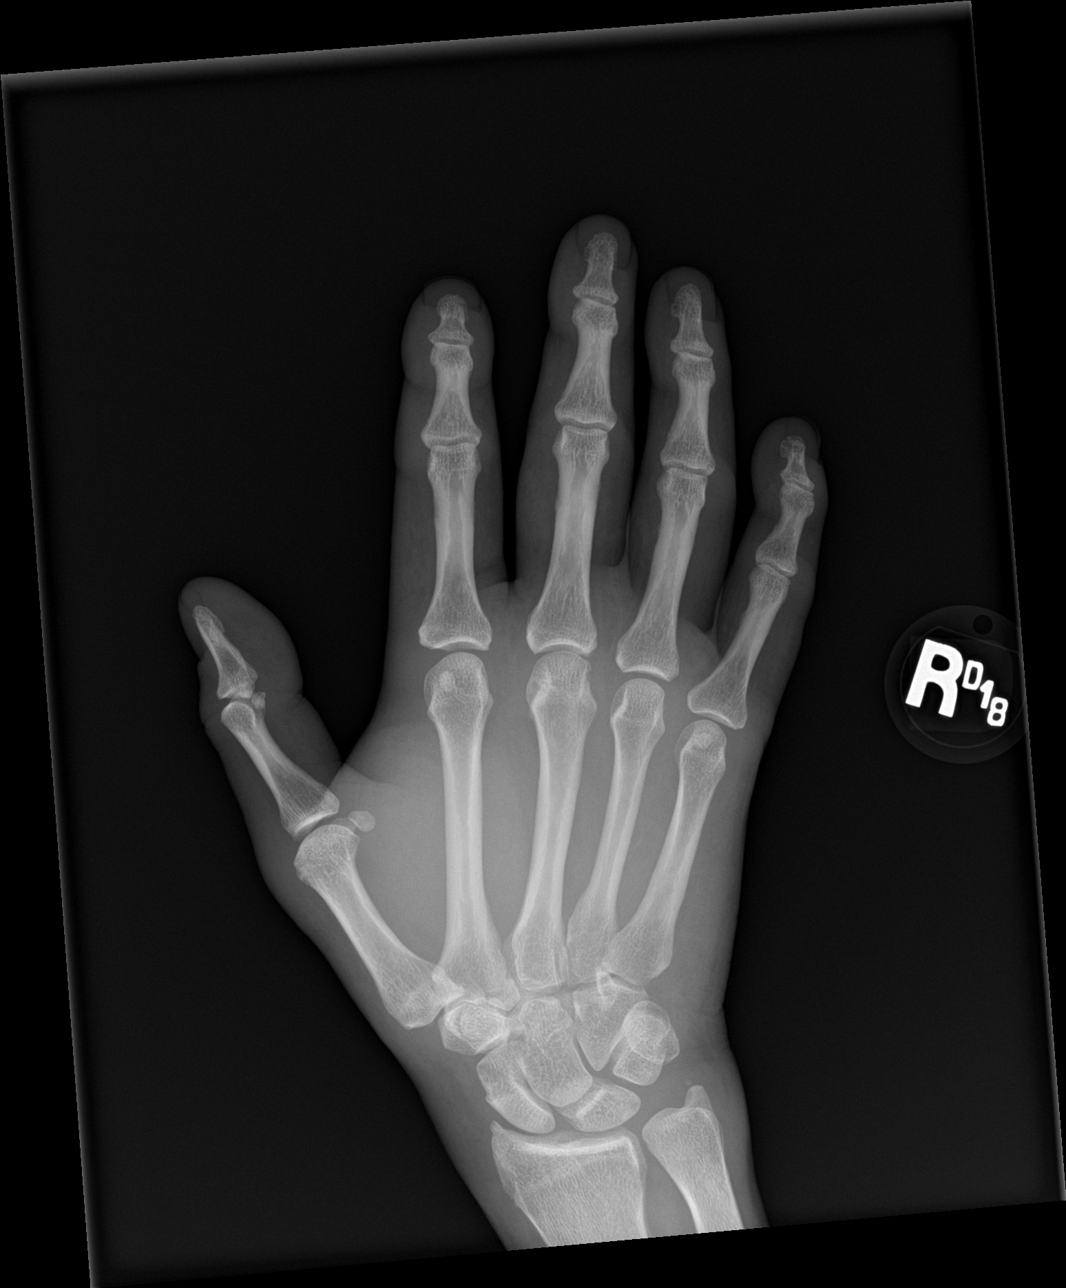

[hand obl]
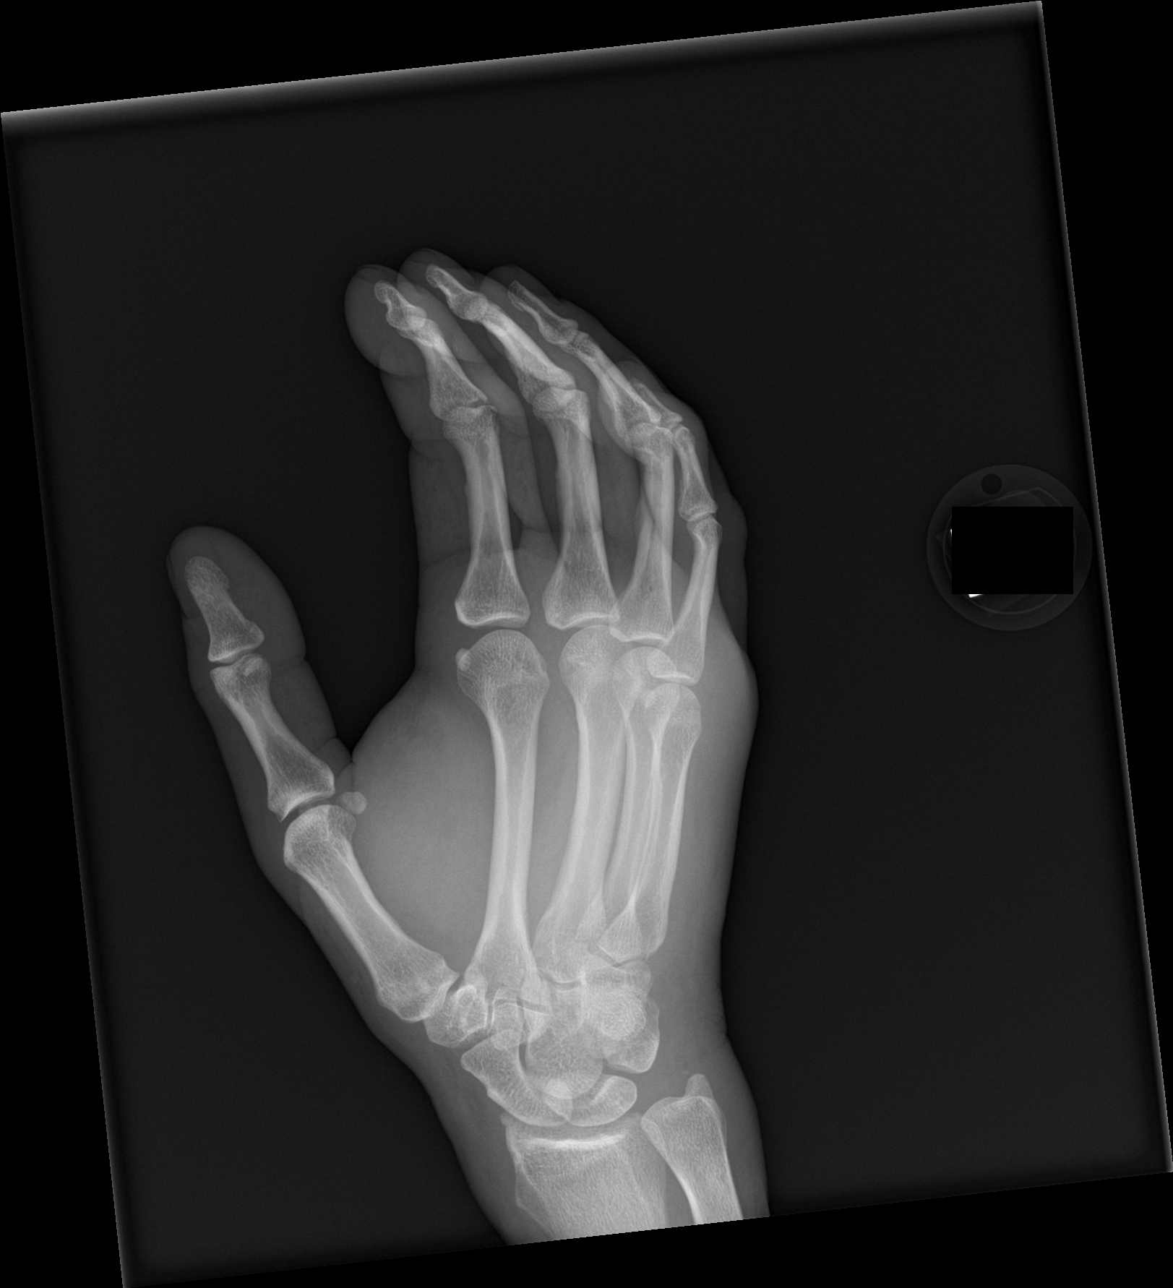

[hand lat]
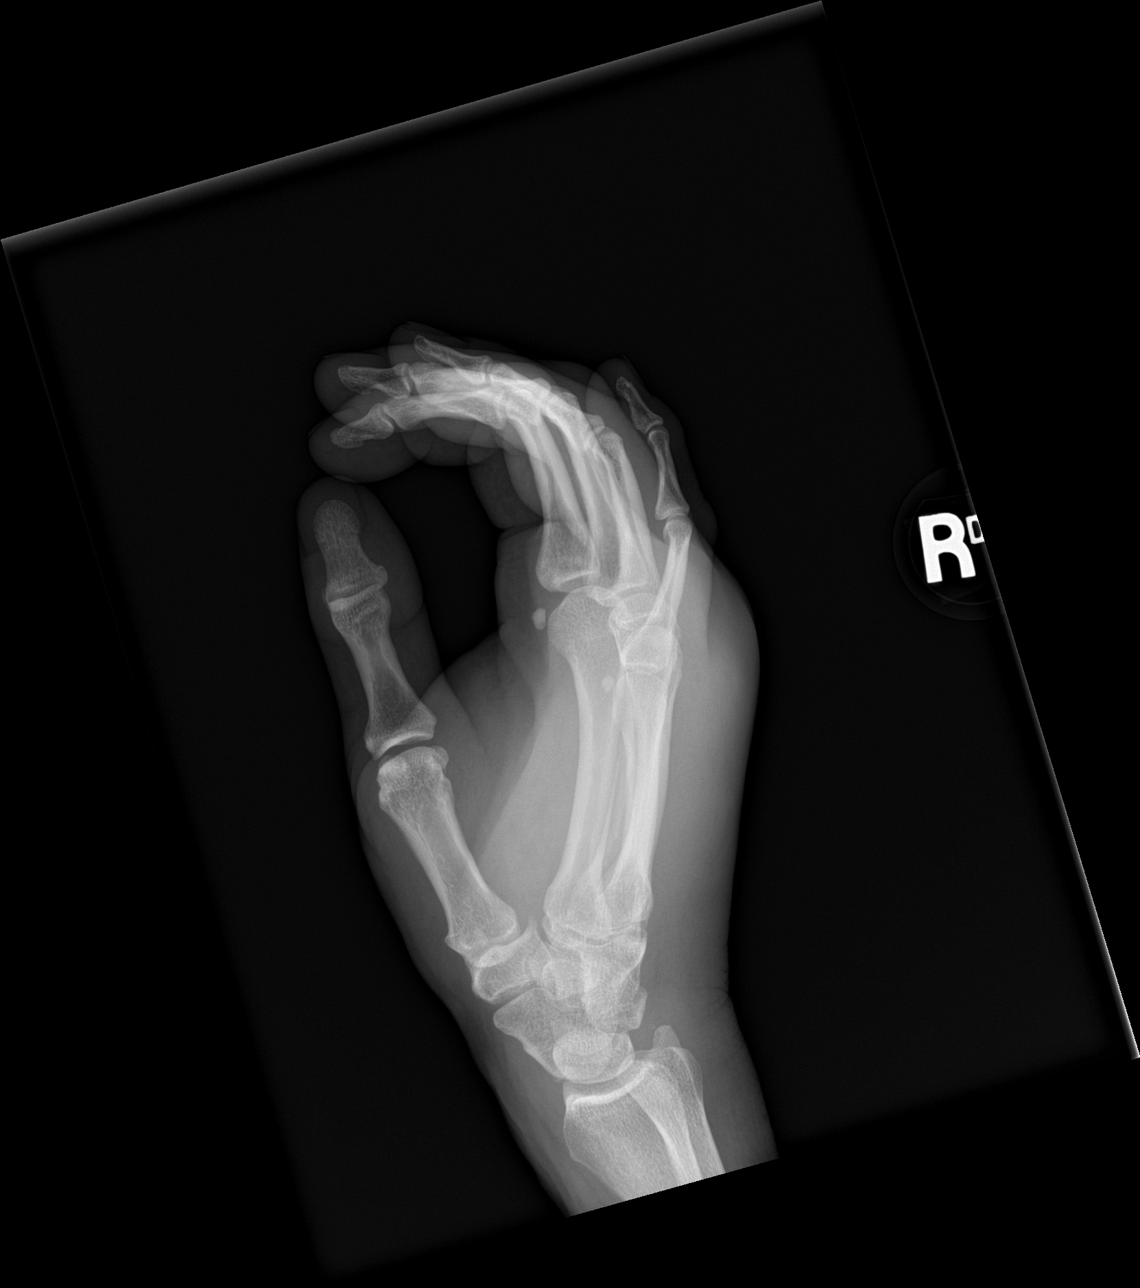

[3 of 3 positions shown; findings below may reference images not displayed]

FINDINGS: Diffuse soft tissue swelling of the hand. Evidence of fracture or
dislocation. No significant arthritis.
IMPRESSION: Diffuse soft tissue swelling but without visible fracture or
dislocation.

## 2022-11-19 IMAGING — CR DG WRIST COMPLETE 3+V*R*
4 series · 4 of 4 positions shown · non-contrast
Comparison: Hand images same day.

CLINICAL DATA: Fell yesterday.  Pain and swelling.

EXAM:
RIGHT WRIST - COMPLETE 3+ VIEW

[wrist pa]
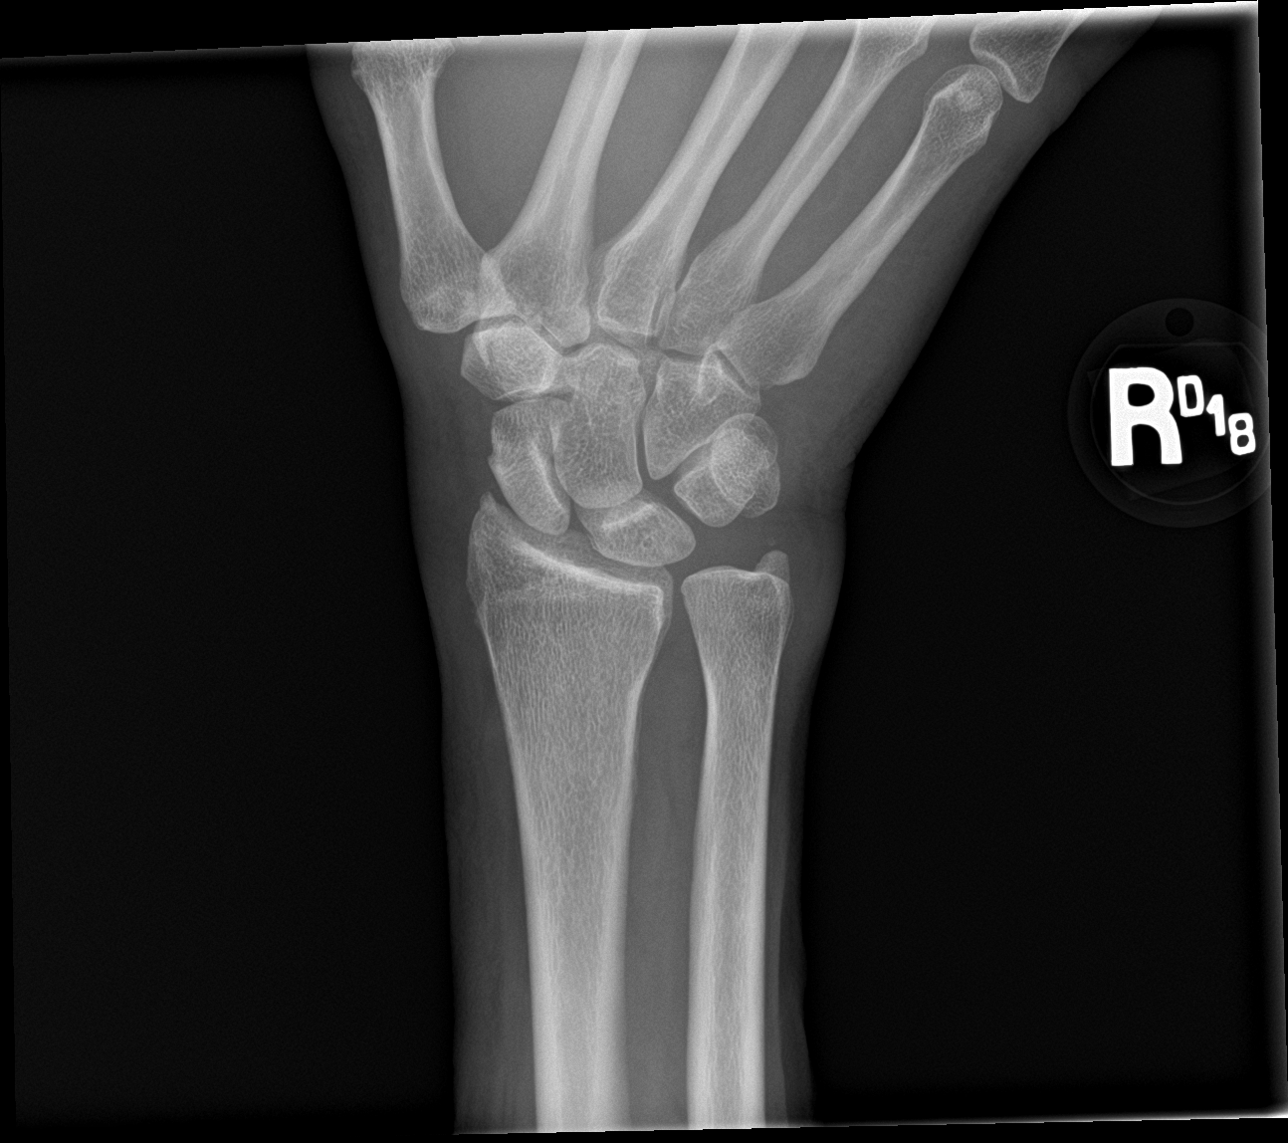

[wrist obl]
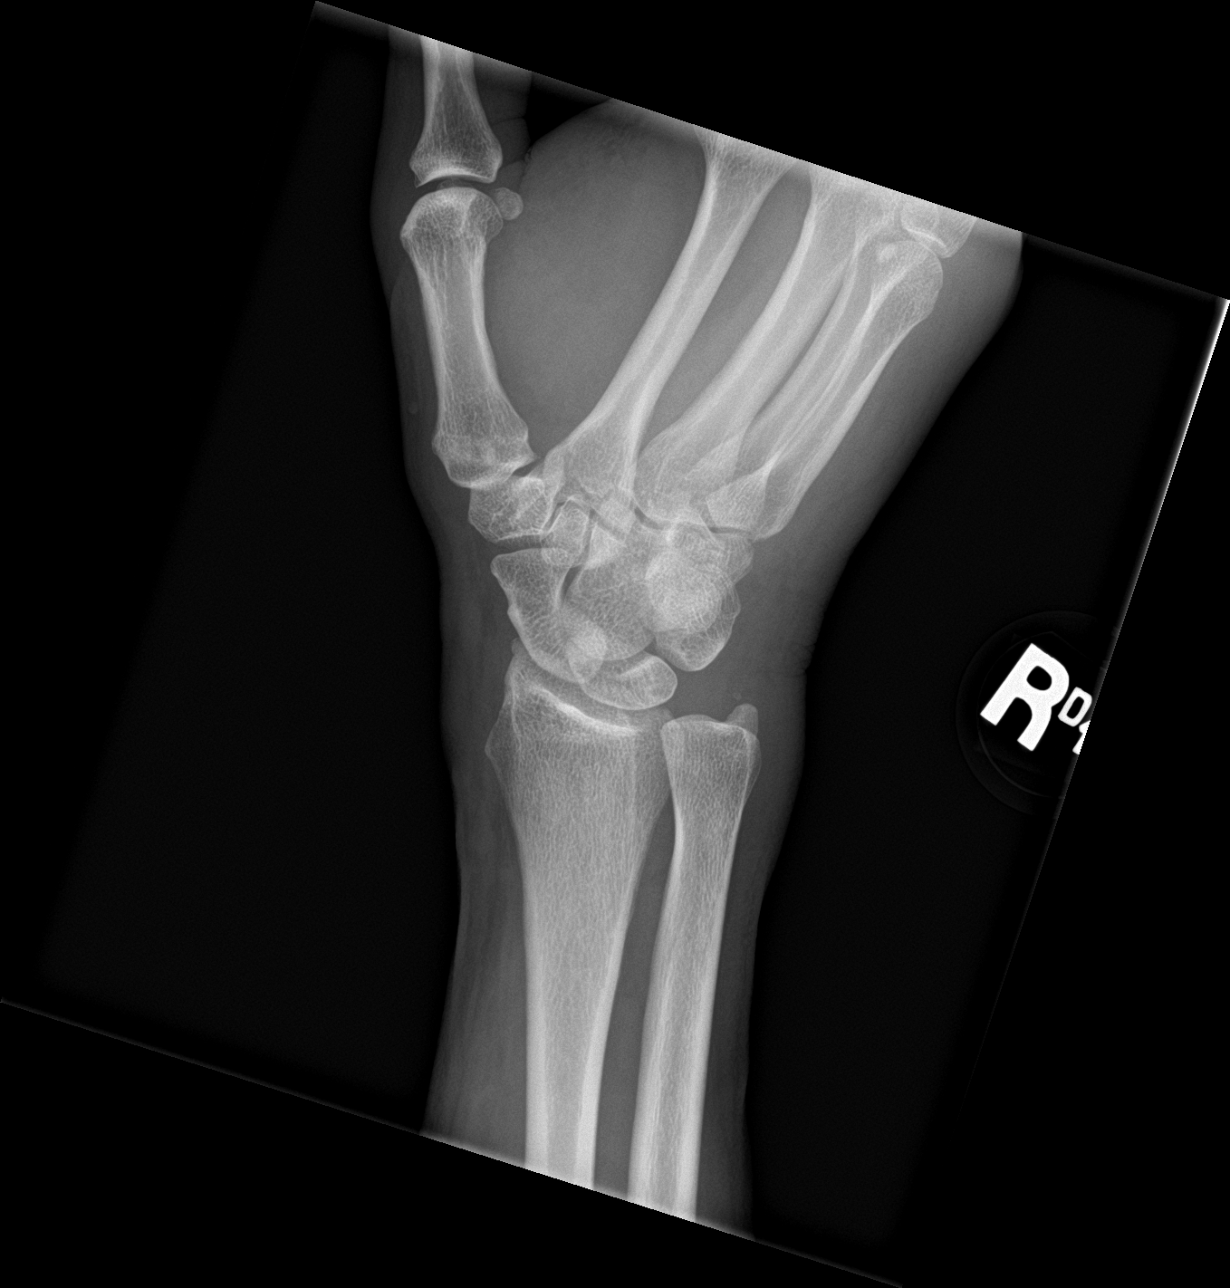

[wrist lat]
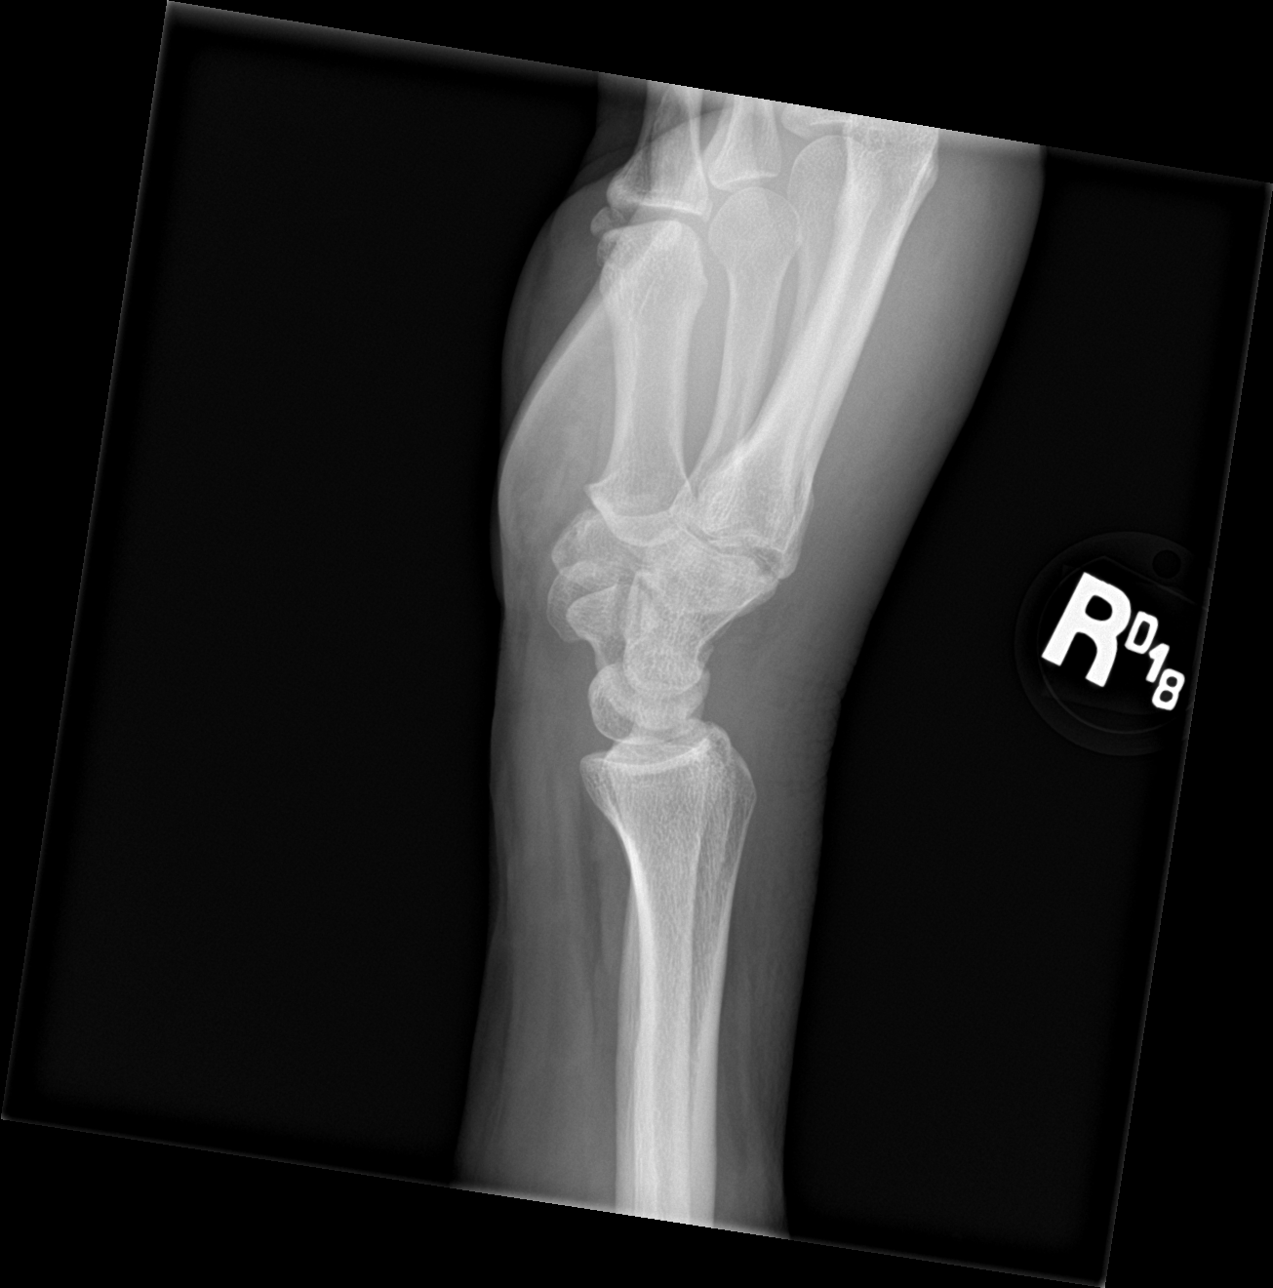

[navicular]
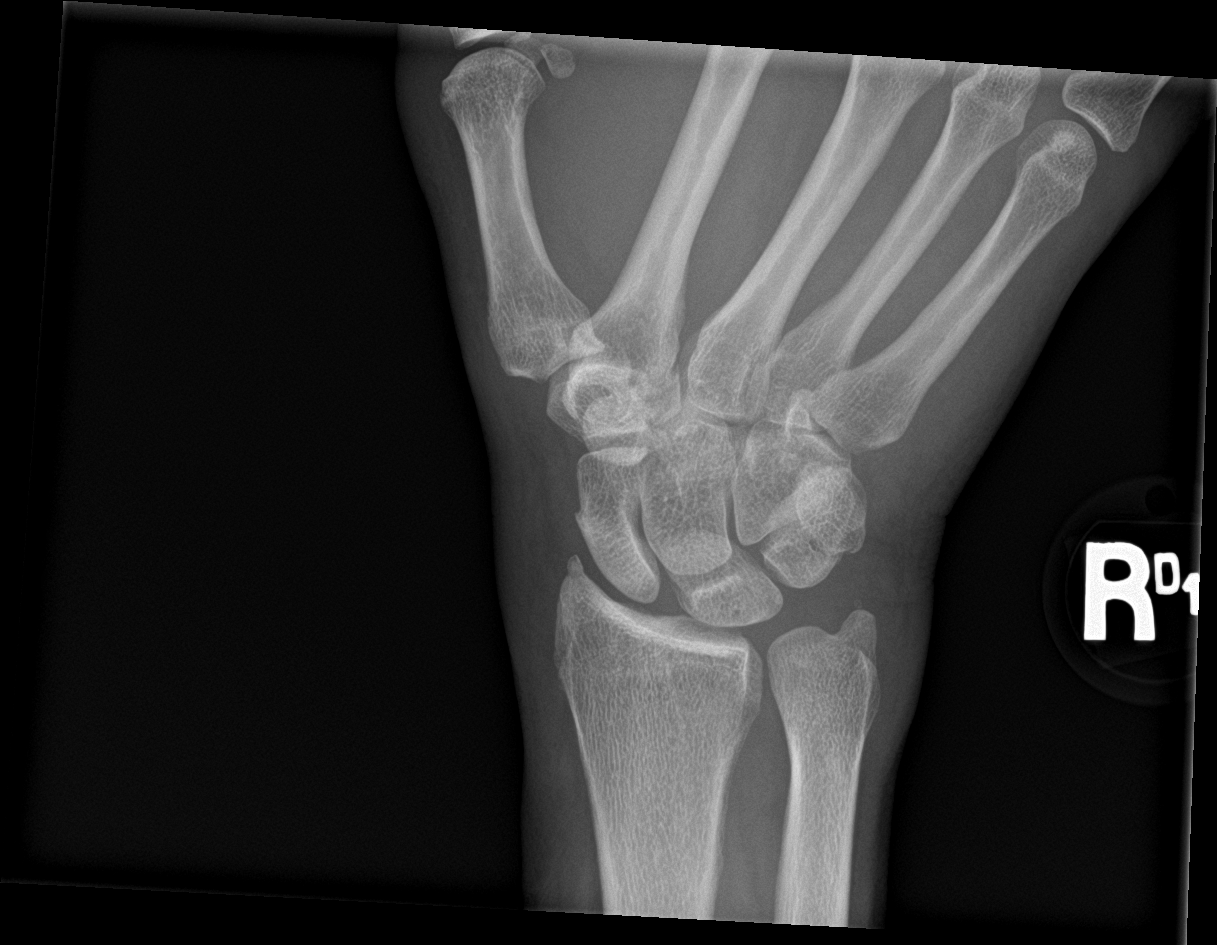

[4 of 4 positions shown; findings below may reference images not displayed]

FINDINGS: Mild widening of the scapholunate distance which could go along with
scapholunate ligament tear. No evidence acute fracture however.
IMPRESSION: Widening of the scapholunate distance suggesting scapholunate
ligament tear, age indeterminate, but possibly acute.

## 2023-08-23 ENCOUNTER — Other Ambulatory Visit: Payer: Self-pay

## 2023-08-23 ENCOUNTER — Emergency Department
Admission: EM | Admit: 2023-08-23 | Discharge: 2023-08-23 | Disposition: A | Discharge status: Home / Self Care | Payer: Self-pay | Attending: Emergency Medicine | Admitting: Emergency Medicine

## 2023-08-23 ENCOUNTER — Encounter: Payer: Self-pay | Admitting: Emergency Medicine

## 2023-08-23 DIAGNOSIS — J069 Acute upper respiratory infection, unspecified: Secondary | ICD-10-CM | POA: Insufficient documentation

## 2023-08-23 DIAGNOSIS — Z1152 Encounter for screening for COVID-19: Secondary | ICD-10-CM | POA: Insufficient documentation

## 2023-08-23 DIAGNOSIS — I10 Essential (primary) hypertension: Secondary | ICD-10-CM | POA: Insufficient documentation

## 2023-08-23 LAB — RESP PANEL BY RT-PCR (RSV, FLU A&B, COVID)  RVPGX2
Influenza A by PCR: NEGATIVE
Influenza B by PCR: NEGATIVE
Resp Syncytial Virus by PCR: NEGATIVE
SARS Coronavirus 2 by RT PCR: NEGATIVE

## 2023-08-23 NOTE — ED Triage Notes (Signed)
Pt via POV from home. Pt c/o productive cough, runny nose, and headache for the past 3 days. Denies any sick contacts. Denies hx of COPD/CHF. Pt is A&OX4 and NAD, ambulatory to triage.

## 2023-08-23 NOTE — ED Provider Notes (Signed)
[facility] Provider Note    Event Date/Time   First MD Initiated Contact with Patient 08/23/23 1020     (approximate)   History   Cough   HPI  Carrie Mcdaniel is a 57 y.o. female   presents to the ED with complaint of productive cough, rhinorrhea and headache for the last 3 days.  Patient is unaware of any known sick contacts.  She denies any fever, chills, nausea, vomiting or diarrhea.  Patient has history of hypertension.      Physical Exam   Triage Vital Signs: ED Triage Vitals  Encounter Vitals Group     BP 08/23/23 1008 (!) 161/93     Systolic BP Percentile --      Diastolic BP Percentile --      Pulse Rate 08/23/23 1008 78     Resp 08/23/23 1008 19     Temp 08/23/23 1008 98.2 F (36.8 C)     Temp src --      SpO2 08/23/23 1008 96 %     Weight 08/23/23 1008 135 lb (61.2 kg)     Height 08/23/23 1008 5\' 8"  (1.727 m)     Head Circumference --      Peak Flow --      Pain Score 08/23/23 1008 0     Pain Loc --      Pain Education --      Exclude from Growth Chart --     Most recent vital signs: Vitals:   08/23/23 1008  BP: (!) 161/93  Pulse: 78  Resp: 19  Temp: 98.2 F (36.8 C)  SpO2: 96%     General: Awake, no distress.  Nontoxic in appearance. CV:  Good peripheral perfusion.  Heart regular rate rhythm. Resp:  Normal effort.  Lungs are clear bilaterally. Abd:  No distention.  Other:  Mild rhinorrhea.   ED Results / Procedures / Treatments   Labs (all labs ordered are listed, but only abnormal results are displayed) Labs Reviewed  RESP PANEL BY RT-PCR (RSV, FLU A&B, COVID)  [id number]     PROCEDURES:  Critical Care performed:   Procedures   MEDICATIONS ORDERED IN ED: Medications - No data to display   IMPRESSION / MDM / ASSESSMENT AND PLAN / ED COURSE  I reviewed the triage vital signs and the nursing notes.   Differential diagnosis includes, but is not limited to, COVID, influenza, RSV, viral upper  respiratory infection.  57 year old female presents to the ED with complaint of upper respiratory symptoms for the last 3 days.  Patient was made aware that there is no medication for COVID being used other than to treat symptoms.  She agrees that rather than sitting in the emergency department she will check the results of her test on MyChart.  Increase fluids and Tylenol or ibuprofen as needed.  Work note was written for her.  She has to follow-up with her PCP if any continued problems.      Patient's presentation is most consistent with acute complicated illness / injury requiring diagnostic workup.  FINAL CLINICAL IMPRESSION(S) / ED DIAGNOSES   Final diagnoses:  Viral URI with cough     Rx / DC Orders   ED Discharge Orders     None        Note:  This document was prepared using Dragon voice recognition software and may include unintentional dictation errors.   Tommi Rumps, PA-C 08/23/23 1436    Ray, Neha,  MD 08/23/23 1546

## 2023-08-23 NOTE — Discharge Instructions (Signed)
Follow-up with your primary care provider or urgent care if any continued problems.  The results of your COVID test can be seen on MyChart.  Over-the-counter cough medication, Tylenol, ibuprofen and drink lots of fluids.

## 2023-09-26 ENCOUNTER — Emergency Department
Admission: EM | Admit: 2023-09-26 | Discharge: 2023-09-26 | Disposition: A | Discharge status: Home / Self Care | Payer: Self-pay | Attending: Emergency Medicine | Admitting: Emergency Medicine

## 2023-09-26 ENCOUNTER — Other Ambulatory Visit: Payer: Self-pay

## 2023-09-26 DIAGNOSIS — S61431A Puncture wound without foreign body of right hand, initial encounter: Secondary | ICD-10-CM | POA: Insufficient documentation

## 2023-09-26 DIAGNOSIS — W268XXA Contact with other sharp object(s), not elsewhere classified, initial encounter: Secondary | ICD-10-CM | POA: Insufficient documentation

## 2023-09-26 DIAGNOSIS — Z23 Encounter for immunization: Secondary | ICD-10-CM | POA: Insufficient documentation

## 2023-09-26 DIAGNOSIS — S6991XA Unspecified injury of right wrist, hand and finger(s), initial encounter: Secondary | ICD-10-CM

## 2023-09-26 MED ORDER — CEPHALEXIN 500 MG PO CAPS: 500.0000 mg | ORAL_CAPSULE | Freq: Two times a day (BID) | ORAL | 0 refills | Status: DC

## 2023-09-26 MED ORDER — TETANUS-DIPHTH-ACELL PERTUSSIS 5-2.5-18.5 LF-MCG/0.5 IM SUSY
0.5000 mL | PREFILLED_SYRINGE | Freq: Once | INTRAMUSCULAR | Status: AC
  Administered 2023-09-26: 0.5 mL via INTRAMUSCULAR
  Filled 2023-09-26: qty 0.5

## 2023-09-26 NOTE — ED Triage Notes (Signed)
Pt to ED POV for injury to R palmar surface of hand last night, states a metal screw injured hand (entered/scraped). Last Tdap 20 years ago. Hand does not appear red or swollen and has superficial, round abrasion/cut.

## 2023-09-26 NOTE — ED Provider Notes (Signed)
   [facility] Provider Note    Event Date/Time   First MD Initiated Contact with Patient 09/26/23 1107     (approximate)   History   Hand Injury   HPI  Carrie Mcdaniel is a 57 y.o. female who suffered abrasion to the right hand/puncture wound last night.  She is concerned about infection and because her tetanus is up-to-date     Physical Exam   Triage Vital Signs: ED Triage Vitals  Encounter Vitals Group     BP 09/26/23 1017 (!) 164/98     Systolic BP Percentile --      Diastolic BP Percentile --      Pulse Rate 09/26/23 1017 90     Resp 09/26/23 1017 16     Temp 09/26/23 1017 98.5 F (36.9 C)     Temp Source 09/26/23 1017 Oral     SpO2 09/26/23 1017 96 %     Weight 09/26/23 1019 65.8 kg (145 lb)     Height 09/26/23 1019 1.727 m (5\' 8" )     Head Circumference --      Peak Flow --      Pain Score 09/26/23 1017 7     Pain Loc --      Pain Education --      Exclude from Growth Chart --     Most recent vital signs: Vitals:   09/26/23 1017  BP: (!) 164/98  Pulse: 90  Resp: 16  Temp: 98.5 F (36.9 C)  SpO2: 96%     General: Awake, no distress.  CV:  Good peripheral perfusion.  Resp:  Normal effort.  Abd:  No distention.  Other:  What appears to be small shallow puncture to the right hand, no signs of infection   ED Results / Procedures / Treatments   Labs (all labs ordered are listed, but only abnormal results are displayed) Labs Reviewed - No data to display   EKG     RADIOLOGY     PROCEDURES:  Critical Care performed:   Procedures   MEDICATIONS ORDERED IN ED: Medications  Tdap (BOOSTRIX) injection 0.5 mL (0.5 mLs Intramuscular Given 09/26/23 1114)     IMPRESSION / MDM / ASSESSMENT AND PLAN / ED COURSE  I reviewed the triage vital signs and the nursing notes. Patient's presentation is most consistent with acute, uncomplicated illness.  Shallow puncture morning for abrasion, tetanus updated, antibiotic  prescription if any signs of redness or infection develop        FINAL CLINICAL IMPRESSION(S) / ED DIAGNOSES   Final diagnoses:  Injury of right hand, initial encounter     Rx / DC Orders   ED Discharge Orders          Ordered    cephALEXin (KEFLEX) 500 MG capsule  2 times daily        09/26/23 1110             Note:  This document was prepared using Dragon voice recognition software and may include unintentional dictation errors.   Jene Every, MD 09/26/23 1149

## 2023-12-18 ENCOUNTER — Emergency Department
Admission: EM | Admit: 2023-12-18 | Discharge: 2023-12-18 | Discharge status: Against Medical Advice | Payer: Self-pay | Attending: Emergency Medicine | Admitting: Emergency Medicine

## 2023-12-18 ENCOUNTER — Other Ambulatory Visit: Payer: Self-pay

## 2023-12-18 DIAGNOSIS — S51802A Unspecified open wound of left forearm, initial encounter: Secondary | ICD-10-CM | POA: Insufficient documentation

## 2023-12-18 DIAGNOSIS — S21102A Unspecified open wound of left front wall of thorax without penetration into thoracic cavity, initial encounter: Secondary | ICD-10-CM | POA: Insufficient documentation

## 2023-12-18 DIAGNOSIS — W540XXA Bitten by dog, initial encounter: Secondary | ICD-10-CM | POA: Insufficient documentation

## 2023-12-18 DIAGNOSIS — Z5321 Procedure and treatment not carried out due to patient leaving prior to being seen by health care provider: Secondary | ICD-10-CM | POA: Insufficient documentation

## 2023-12-18 NOTE — ED Triage Notes (Addendum)
Patient was bit by her dog lastnight. Presents with animal bite to the left side of chest and left lower forearm. Dog is not up to date on rabies. Patient has some swelling to the left side of her breast. EMS /Fire came to the house lastnight but the patient declined to come to the hospital.

## 2024-09-27 ENCOUNTER — Other Ambulatory Visit: Payer: Self-pay

## 2024-09-27 ENCOUNTER — Emergency Department: Payer: Self-pay

## 2024-09-27 ENCOUNTER — Emergency Department
Admission: EM | Admit: 2024-09-27 | Discharge: 2024-09-27 | Disposition: A | Discharge status: Home / Self Care | Payer: Self-pay | Attending: Emergency Medicine | Admitting: Emergency Medicine

## 2024-09-27 DIAGNOSIS — M25512 Pain in left shoulder: Secondary | ICD-10-CM | POA: Insufficient documentation

## 2024-09-27 DIAGNOSIS — Y990 Civilian activity done for income or pay: Secondary | ICD-10-CM | POA: Insufficient documentation

## 2024-09-27 DIAGNOSIS — W19XXXA Unspecified fall, initial encounter: Secondary | ICD-10-CM | POA: Insufficient documentation

## 2024-09-27 DIAGNOSIS — I10 Essential (primary) hypertension: Secondary | ICD-10-CM | POA: Insufficient documentation

## 2024-09-27 MED ORDER — PREDNISONE 10 MG PO TABS
ORAL_TABLET | ORAL | 0 refills | Status: AC
Start: 2024-09-27 — End: ?

## 2024-09-27 NOTE — ED Provider Notes (Signed)
 [facility] Provider Note    Event Date/Time   First MD Initiated Contact with Patient 09/27/24 [phone]     (approximate)   History   Shoulder Injury   HPI  Carrie Mcdaniel is a 58 y.o. female   presents to the ED with complaint of left shoulder and left arm pain after falling twice in the last 30 days.  Patient states that initially her dog caused her to fall while it was coming in the house.  The second fall occurred 2 weeks ago while patient was at work.  No loss of consciousness or head injury.  She has been taking ibuprofen  800 mg 3 times daily without relief of her pain.  Patient has history of hypertension.      Physical Exam   Triage Vital Signs: ED Triage Vitals  Encounter Vitals Group     BP 09/27/24 0812 (!) 141/97     Girls Systolic BP Percentile --      Girls Diastolic BP Percentile --      Boys Systolic BP Percentile --      Boys Diastolic BP Percentile --      Pulse Rate 09/27/24 0812 67     Resp 09/27/24 0812 16     Temp 09/27/24 0812 98.1 F (36.7 C)     Temp src --      SpO2 09/27/24 0812 100 %     Weight 09/27/24 0810 145 lb (65.8 kg)     Height 09/27/24 0810 5' 8 (1.727 m)     Head Circumference --      Peak Flow --      Pain Score 09/27/24 0810 9     Pain Loc --      Pain Education --      Exclude from Growth Chart --     Most recent vital signs: Vitals:   09/27/24 0812  BP: (!) 141/97  Pulse: 67  Resp: 16  Temp: 98.1 F (36.7 C)  SpO2: 100%     General: Awake, no distress.  CV:  Good peripheral perfusion.  Heart regular rate and rhythm. Resp:  Normal effort.  Lungs clear bilaterally. Abd:  No distention.  Other:  Examination of the left shoulder there is no gross deformity or soft tissue edema.  There is some generalized tenderness on palpation of the anterior portion of the left shoulder and upper humeral area.  Patient is able to move in all planes without restriction.  Radial pulse present.  No sensory  function intact distal to the injury.  No pain or soft tissue edema noted on palpation of the elbow.   ED Results / Procedures / Treatments   Labs (all labs ordered are listed, but only abnormal results are displayed) Labs Reviewed - No data to display   RADIOLOGY Left shoulder x-ray images were reviewed and interpreted by myself independent of the radiologist and was negative for fracture or dislocation.  Official radiology report is negative for acute bony abnormality.    PROCEDURES:  Critical Care performed:   Procedures   MEDICATIONS ORDERED IN ED: Medications - No data to display   IMPRESSION / MDM / ASSESSMENT AND PLAN / ED COURSE  I reviewed the triage vital signs and the nursing notes.   Differential diagnosis includes, but is not limited to, left shoulder fracture, contusion, dislocation, osteoarthritis.  58 year old female presents to the ED with complaint of left shoulder pain and history of 2 injuries to her shoulder  in the past month.  X-rays were obtained and patient was reassured.  A prescription for prednisone  was sent to the pharmacy as she states she has been taking ibuprofen  without any relief.  She has to follow-up with her PCP or urgent care if any continued problems.      Patient's presentation is most consistent with acute complicated illness / injury requiring diagnostic workup.  FINAL CLINICAL IMPRESSION(S) / ED DIAGNOSES   Final diagnoses:  Left shoulder pain, unspecified chronicity     Rx / DC Orders   ED Discharge Orders          Ordered    predniSONE  (DELTASONE ) 10 MG tablet        09/27/24 9057             Note:  This document was prepared using Dragon voice recognition software and may include unintentional dictation errors.   Saunders Shona CROME, PA-C 09/27/24 1208    Claudene Rover, MD 09/27/24 [phone]

## 2024-09-27 NOTE — Discharge Instructions (Addendum)
 Follow-up with your primary care provider or urgent care if any continued problems or concerns.  A prescription for prednisone  was sent to the pharmacy for you to begin taking 3 tablets every day for the next 5 days.  As we discussed you can take Tylenol  with this medication if additional pain medication is needed.  You may also use a gel over-the-counter called Voltaren and also obtain lidocaine patches to apply to your arm which may help with pain as well.

## 2024-09-27 NOTE — ED Triage Notes (Signed)
 Pt to ED for left shoulder injury x1 month. Reports dog made pt fall onto left shoulder.

## 2024-09-27 NOTE — ED Notes (Signed)
 See triage note  Presents with pain to left shoulder  States she was knocked down by her dog in sept  Then fell again at work  Landed on left shoulder  No deformity noted  But increased pain with movement
